# Patient Record
Sex: Male | Born: 1980 | Race: Asian | Hispanic: No | Marital: Married
Health system: Southern US, Community
[De-identification: ages and names within clinical notes are randomized; demographics above are authoritative.]

## PROBLEM LIST (undated history)

## (undated) DIAGNOSIS — N200 Calculus of kidney: Secondary | ICD-10-CM

## (undated) DIAGNOSIS — Z87442 Personal history of urinary calculi: Secondary | ICD-10-CM

## (undated) DIAGNOSIS — R413 Other amnesia: Secondary | ICD-10-CM

## (undated) DIAGNOSIS — N201 Calculus of ureter: Secondary | ICD-10-CM

## (undated) DIAGNOSIS — Z9889 Other specified postprocedural states: Secondary | ICD-10-CM

## (undated) DIAGNOSIS — R112 Nausea with vomiting, unspecified: Secondary | ICD-10-CM

## (undated) HISTORY — PX: PERCUTANEOUS NEPHROSTOLITHOTOMY: SHX2207

## (undated) HISTORY — PX: OTHER SURGICAL HISTORY: SHX169

---
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
## 2016-01-03 DIAGNOSIS — Z87828 Personal history of other (healed) physical injury and trauma: Secondary | ICD-10-CM

## 2016-01-03 HISTORY — DX: Personal history of other (healed) physical injury and trauma: Z87.828

## 2017-12-06 ENCOUNTER — Emergency Department (HOSPITAL_COMMUNITY)
Admission: EM | Admit: 2017-12-06 | Discharge: 2017-12-06 | Disposition: A | Discharge status: Home / Self Care | Payer: No Typology Code available for payment source | Attending: Emergency Medicine | Admitting: Emergency Medicine

## 2017-12-06 ENCOUNTER — Encounter (HOSPITAL_COMMUNITY): Payer: Self-pay

## 2017-12-06 DIAGNOSIS — Y990 Civilian activity done for income or pay: Secondary | ICD-10-CM | POA: Diagnosis not present

## 2017-12-06 DIAGNOSIS — W228XXA Striking against or struck by other objects, initial encounter: Secondary | ICD-10-CM | POA: Diagnosis not present

## 2017-12-06 DIAGNOSIS — Z23 Encounter for immunization: Secondary | ICD-10-CM | POA: Insufficient documentation

## 2017-12-06 DIAGNOSIS — Y939 Activity, unspecified: Secondary | ICD-10-CM | POA: Insufficient documentation

## 2017-12-06 DIAGNOSIS — S01111A Laceration without foreign body of right eyelid and periocular area, initial encounter: Secondary | ICD-10-CM | POA: Diagnosis not present

## 2017-12-06 DIAGNOSIS — S0181XA Laceration without foreign body of other part of head, initial encounter: Secondary | ICD-10-CM

## 2017-12-06 DIAGNOSIS — Y929 Unspecified place or not applicable: Secondary | ICD-10-CM | POA: Diagnosis not present

## 2017-12-06 HISTORY — DX: Calculus of kidney: N20.0

## 2017-12-06 MED ORDER — TETANUS-DIPHTH-ACELL PERTUSSIS 5-2.5-18.5 LF-MCG/0.5 IM SUSP
0.5000 mL | Freq: Once | INTRAMUSCULAR | Status: AC
  Administered 2017-12-06: 0.5 mL via INTRAMUSCULAR
  Filled 2017-12-06: qty 0.5

## 2017-12-06 NOTE — ED Provider Notes (Signed)
[facility] EMERGENCY DEPARTMENT Provider Note   CSN: [id number] Arrival date & time: 12/06/17  0030     History   Chief Complaint Chief Complaint  Patient presents with  . Laceration    HPI John Salas is a 37 y.o. male with a past medical history of kidney stones who presents today for evaluation of a facial laceration.  He was at work when he pulled a box of a shelf and it struck him on the right side of his face.  The box itself was not heavy.  He denies any visual changes.  He is unsure when his last tetanus shot was.  HPI  Past Medical History:  Diagnosis Date  . Kidney stones     There are no active problems to display for this patient.   Past Surgical History:  Procedure Laterality Date  . kidney stones          Home Medications    Prior to Admission medications   Not on File    Family History No family history on file.  Social History Social History   Tobacco Use  . Smoking status: Not on file  Substance Use Topics  . Alcohol use: Not on file  . Drug use: Not on file     Allergies   Patient has no known allergies.   Review of Systems Review of Systems  Constitutional: Negative for chills and fever.  Eyes: Negative for pain, redness and visual disturbance.  Gastrointestinal: Negative for nausea and vomiting.  Skin: Positive for wound.  Neurological: Negative for dizziness, light-headedness and headaches.  All other systems reviewed and are negative.    Physical Exam Updated Vital Signs BP 126/90 (BP Location: Right Arm)   Pulse 64   Temp 98.1 F (36.7 C) (Oral)   Resp 14   SpO2 100%   Physical Exam  Constitutional: He is oriented to person, place, and time. He appears well-developed. No distress.  HENT:  Head: Normocephalic.  Mouth/Throat: Oropharynx is clear and moist.  1 cm superficial laceration on the lateral part of the right eyebrow.  No obvious bleeding.  No surrounding erythema, edema.  No  crepitus or palpable deformity.  No raccoon's eyes or battle signs bilaterally.  Eyes: Pupils are equal, round, and reactive to light. EOM are normal.  Full pain-free EOM  Neurological: He is alert and oriented to person, place, and time.  Conversant, moves all 4 extremities.  Speech is not slurred.  Skin: Skin is warm and dry. He is not diaphoretic.  1 cm facial laceration, through the dermis partially  Nursing note and vitals reviewed.    ED Treatments / Results  Labs (all labs ordered are listed, but only abnormal results are displayed) Labs Reviewed - No data to display  EKG None  Radiology No results found.  Procedures .Marland KitchenLaceration Repair Date/Time: 12/06/2017 3:06 AM Performed by: Cristina Gong, PA-C Authorized by: Cristina Gong, PA-C   Consent:    Consent obtained:  Verbal   Consent given by:  Patient   Risks discussed:  Infection, need for additional repair, poor cosmetic result, pain, retained foreign body, tendon damage, vascular damage, poor wound healing and nerve damage   Alternatives discussed:  No treatment and referral (Alternative wound closures) Anesthesia (see MAR for exact dosages):    Anesthesia method:  None Laceration details:    Location:  Face   Face location:  R eyebrow   Length (cm):  1 Repair type:  Repair type:  Simple Exploration:    Hemostasis achieved with:  Direct pressure   Wound exploration: wound explored through full range of motion and entire depth of wound probed and visualized     Wound extent: no foreign bodies/material noted   Treatment:    Area cleansed with:  Shur-Clens   Amount of cleaning:  Standard Skin repair:    Repair method:  Tissue adhesive Approximation:    Approximation:  Close Post-procedure details:    Patient tolerance of procedure:  Tolerated well, no immediate complications   (including critical care time)  Medications Ordered in ED Medications  Tdap (BOOSTRIX) injection 0.5 mL (has no  administration in time range)     Initial Impression / Assessment and Plan / ED Course  I have reviewed the triage vital signs and the nursing notes.  Pertinent labs & imaging results that were available during my care of the patient were reviewed by me and considered in my medical decision making (see chart for details).    Patient presents today for evaluation of a 1 cm laceration on his right elbow that occurred after a box he was moving shifted scraping the right side of his face.  He is unsure when his last tetanus shot was, therefore Tdap was ordered.  Bleeding controlled, there is no surrounding swelling, bruising, or crepitus.  There is no abnormal redness of the eye and he has full pain-free EOMs.  Grossly neurologically intact.  Discussed closure methods, and elected for Dermabond.  OTC pain medicine.  Antibiotics not indicated, given mechanism, very small superficial laceration without surrounding swelling crepitus or abnormalities radiology is not indicated at this time.    Return precautions were discussed with patient who states their understanding.  At the time of discharge patient denied any unaddressed complaints or concerns.  Patient is agreeable for discharge home.    Final Clinical Impressions(s) / ED Diagnoses   Final diagnoses:  Facial laceration, initial encounter    ED Discharge Orders    None       Norman ClayHammond, Kayelee Herbig W, PA-C 12/06/17 0309    Dione BoozeGlick, David, MD 12/06/17 [phone]

## 2017-12-06 NOTE — ED Notes (Signed)
Small lac noted to R eyebrow; band aid in place. Wound cleansed causing slight bleeding; bandage reapplied. Dermabond and steri strips at bedside

## 2017-12-06 NOTE — Discharge Instructions (Addendum)
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
## 2019-10-10 ENCOUNTER — Emergency Department (HOSPITAL_BASED_OUTPATIENT_CLINIC_OR_DEPARTMENT_OTHER)
Admission: EM | Admit: 2019-10-10 | Discharge: 2019-10-10 | Disposition: A | Discharge status: Home / Self Care | Payer: Self-pay | Attending: Emergency Medicine | Admitting: Emergency Medicine

## 2019-10-10 ENCOUNTER — Other Ambulatory Visit: Payer: Self-pay

## 2019-10-10 ENCOUNTER — Encounter (HOSPITAL_BASED_OUTPATIENT_CLINIC_OR_DEPARTMENT_OTHER): Payer: Self-pay | Admitting: *Deleted

## 2019-10-10 ENCOUNTER — Emergency Department (HOSPITAL_BASED_OUTPATIENT_CLINIC_OR_DEPARTMENT_OTHER): Payer: Self-pay

## 2019-10-10 DIAGNOSIS — N201 Calculus of ureter: Secondary | ICD-10-CM | POA: Insufficient documentation

## 2019-10-10 DIAGNOSIS — F17200 Nicotine dependence, unspecified, uncomplicated: Secondary | ICD-10-CM | POA: Insufficient documentation

## 2019-10-10 LAB — COMPREHENSIVE METABOLIC PANEL
ALT: 21 U/L (ref 0–44)
AST: 23 U/L (ref 15–41)
Albumin: 4 g/dL (ref 3.5–5.0)
Alkaline Phosphatase: 84 U/L (ref 38–126)
Anion gap: 7 (ref 5–15)
BUN: 17 mg/dL (ref 6–20)
CO2: 25 mmol/L (ref 22–32)
Calcium: 8.5 mg/dL — ABNORMAL LOW (ref 8.9–10.3)
Chloride: 100 mmol/L (ref 98–111)
Creatinine, Ser: 0.89 mg/dL (ref 0.61–1.24)
GFR, Estimated: >60 mL/min (ref >60)
Glucose, Bld: 97 mg/dL (ref 70–99)
Potassium: 3.8 mmol/L (ref 3.5–5.1)
Sodium: 132 mmol/L — ABNORMAL LOW (ref 135–145)
Total Bilirubin: 0.3 mg/dL (ref 0.3–1.2)
Total Protein: 7.3 g/dL (ref 6.5–8.1)

## 2019-10-10 LAB — URINALYSIS, ROUTINE W REFLEX MICROSCOPIC
Bilirubin Urine: NEGATIVE
Glucose, UA: NEGATIVE mg/dL
Ketones, ur: NEGATIVE mg/dL
Leukocytes,Ua: NEGATIVE
Nitrite: NEGATIVE
Protein, ur: NEGATIVE mg/dL
Specific Gravity, Urine: 1.015 (ref 1.005–1.030)
pH: 6 (ref 5.0–8.0)

## 2019-10-10 LAB — CBC WITH DIFFERENTIAL/PLATELET
Abs Immature Granulocytes: 0.03 10*3/uL (ref 0.00–0.07)
Basophils Absolute: 0.1 10*3/uL (ref 0.0–0.1)
Basophils Relative: 1 %
Eosinophils Absolute: 0.2 10*3/uL (ref 0.0–0.5)
Eosinophils Relative: 2 %
HCT: 41 % (ref 39.0–52.0)
Hemoglobin: 13.4 g/dL (ref 13.0–17.0)
Immature Granulocytes: 0 %
Lymphocytes Relative: 31 %
Lymphs Abs: 3.1 10*3/uL (ref 0.7–4.0)
MCH: 26.3 pg (ref 26.0–34.0)
MCHC: 32.7 g/dL (ref 30.0–36.0)
MCV: 80.6 fL (ref 80.0–100.0)
Monocytes Absolute: 0.8 10*3/uL (ref 0.1–1.0)
Monocytes Relative: 8 %
Neutro Abs: 5.9 10*3/uL (ref 1.7–7.7)
Neutrophils Relative %: 58 %
Platelets: 272 10*3/uL (ref 150–400)
RBC: 5.09 MIL/uL (ref 4.22–5.81)
RDW: 13.3 % (ref 11.5–15.5)
WBC: 10.1 10*3/uL (ref 4.0–10.5)
nRBC: 0 % (ref 0.0–0.2)

## 2019-10-10 LAB — URINALYSIS, MICROSCOPIC (REFLEX)

## 2019-10-10 MED ORDER — ONDANSETRON HCL 4 MG PO TABS: 4.0000 mg | ORAL_TABLET | Freq: Three times a day (TID) | ORAL | 0 refills | Status: AC | PRN

## 2019-10-10 MED ORDER — OXYCODONE-ACETAMINOPHEN 5-325 MG PO TABS: 1.0000 | ORAL_TABLET | ORAL | 0 refills | Status: DC | PRN

## 2019-10-10 MED ORDER — TAMSULOSIN HCL 0.4 MG PO CAPS: 0.4000 mg | ORAL_CAPSULE | Freq: Every day | ORAL | 0 refills | Status: AC

## 2019-10-10 MED ORDER — SODIUM CHLORIDE 0.9 % IV BOLUS
1000.0000 mL | Freq: Once | INTRAVENOUS | Status: AC
  Administered 2019-10-10: 1000 mL via INTRAVENOUS

## 2019-10-10 MED ORDER — ONDANSETRON HCL 4 MG/2ML IJ SOLN
4.0000 mg | Freq: Once | INTRAMUSCULAR | Status: AC
  Administered 2019-10-10: 4 mg via INTRAVENOUS
  Filled 2019-10-10: qty 2

## 2019-10-10 MED ORDER — KETOROLAC TROMETHAMINE 30 MG/ML IJ SOLN
15.0000 mg | Freq: Once | INTRAMUSCULAR | Status: AC
  Administered 2019-10-10: 15 mg via INTRAVENOUS
  Filled 2019-10-10: qty 1

## 2019-10-10 NOTE — Discharge Instructions (Addendum)
You have a 77mm stone in your right ureter.  Get rechecked immediately at the [facility] [place] Emergency Department if you develop fevers, uncontrolled pain or cannot urinate.

## 2019-10-10 NOTE — ED Triage Notes (Signed)
C/o sudden onset of right flank pain x 2 hrs , hx stones

## 2019-10-10 NOTE — ED Provider Notes (Signed)
[facility] EMERGENCY DEPARTMENT Provider Note   CSN: [id number] Arrival date & time: 10/10/19  1607     History Chief Complaint  Patient presents with  . Flank Pain    John Salas is a 39 y.o. male.  The history is provided by the patient and the spouse.  Flank Pain   John Salas is a 39 y.o. male who presents to the Emergency Department complaining of flank pain.  He presents to the ED accompanied by his wife for evaluation of right flank pain that started that started at 930 this morning.  Pain is pulsating, non-radiating, severe and constant in nature.  Has associated nausea.  Denies fevers, dysuria, hematuria.  Had dysuria a few days ago - now resolved.  Has a hx/o kidney stones that required lithotripsy 15 years ago in [place] for left sided kidney stones.      Past Medical History:  Diagnosis Date  . Kidney stone   . Kidney stones     There are no problems to display for this patient.   Past Surgical History:  Procedure Laterality Date  . kidney stones         No family history on file.  Social History   Tobacco Use  . Smoking status: Current Every Day Smoker    Packs/day: 0.50  . Smokeless tobacco: Never Used  Vaping Use  . Vaping Use: Every day  Substance Use Topics  . Alcohol use: Not Currently  . Drug use: Not Currently    Home Medications Prior to Admission medications   Medication Sig Start Date End Date Taking? Authorizing Provider  ondansetron (ZOFRAN) 4 MG tablet Take 1 tablet (4 mg total) by mouth every 8 (eight) hours as needed for nausea or vomiting. 10/10/19   Tilden Fossa, MD  oxyCODONE-acetaminophen (PERCOCET/ROXICET) 5-325 MG tablet Take 1 tablet by mouth every 4 (four) hours as needed for severe pain. 10/10/19   Tilden Fossa, MD  tamsulosin (FLOMAX) 0.4 MG CAPS capsule Take 1 capsule (0.4 mg total) by mouth daily. 10/10/19   Tilden Fossa, MD    Allergies    Patient has no known allergies.  Review of Systems     Review of Systems  Genitourinary: Positive for flank pain.  All other systems reviewed and are negative.   Physical Exam Updated Vital Signs BP 111/78 (BP Location: Right Arm)   Pulse (!) 45   Temp (!) 97.5 F (36.4 C) (Oral)   Resp 16   SpO2 100%   Physical Exam Vitals and nursing note reviewed.  Constitutional:      General: He is in acute distress.     Appearance: He is well-developed.     Comments: Uncomfortable appearing  HENT:     Head: Normocephalic and atraumatic.  Cardiovascular:     Rate and Rhythm: Normal rate and regular rhythm.  Pulmonary:     Effort: Pulmonary effort is normal. No respiratory distress.  Abdominal:     Palpations: Abdomen is soft.     Tenderness: There is no abdominal tenderness. There is no right CVA tenderness, left CVA tenderness, guarding or rebound.  Musculoskeletal:        General: No swelling or tenderness.  Skin:    General: Skin is warm and dry.  Neurological:     Mental Status: He is alert and oriented to person, place, and time.  Psychiatric:        Behavior: Behavior normal.     ED Results / Procedures / Treatments  Labs (all labs ordered are listed, but only abnormal results are displayed) Labs Reviewed  URINALYSIS, ROUTINE W REFLEX MICROSCOPIC - Abnormal; Notable for the following components:      Result Value   Color, Urine STRAW (*)    Hgb urine dipstick SMALL (*)    All other components within normal limits  URINALYSIS, MICROSCOPIC (REFLEX) - Abnormal; Notable for the following components:   Bacteria, UA FEW (*)    All other components within normal limits  COMPREHENSIVE METABOLIC PANEL - Abnormal; Notable for the following components:   Sodium 132 (*)    Calcium 8.5 (*)    All other components within normal limits  URINE CULTURE  CBC WITH DIFFERENTIAL/PLATELET    EKG None  Radiology CT Renal Stone Study  Result Date: 10/10/2019 CLINICAL DATA:  Acute onset of right flank pain for 6 hours. Nausea.  Nephrolithiasis. EXAM: CT ABDOMEN AND PELVIS WITHOUT CONTRAST TECHNIQUE: Multidetector CT imaging of the abdomen and pelvis was performed following the standard protocol without IV contrast. COMPARISON:  None. FINDINGS: Lower chest: No acute findings. Hepatobiliary: No mass visualized on this unenhanced exam. Gallbladder is unremarkable. No evidence of biliary ductal dilatation. Pancreas: No mass or inflammatory process visualized on this unenhanced exam. Spleen:  Within normal limits in size. Adrenals/Urinary tract: Moderate right hydroureteronephrosis is seen due to a 9 mm calculus in the distal right ureter near the UVJ. Mild right perinephric stranding is seen as well as mild right renal atrophy. No other calculi identified. Stomach/Bowel: No evidence of obstruction, inflammatory process, or abnormal fluid collections. Aortic atherosclerotic calcification noted. Vascular/Lymphatic: No pathologically enlarged lymph nodes identified. No evidence of abdominal aortic aneurysm. Reproductive:  No mass or other significant abnormality. Other:  None. Musculoskeletal:  No suspicious bone lesions identified. IMPRESSION: Moderate right hydroureteronephrosis due to 9 mm distal right ureteral calculus near the UVJ. Aortic Atherosclerosis (ICD10-I70.0). Electronically Signed   By: Danae Orleans M.D.   On: 10/10/2019 16:57    Procedures Procedures (including critical care time)  Medications Ordered in ED Medications  ketorolac (TORADOL) 30 MG/ML injection 15 mg (15 mg Intravenous Given 10/10/19 1721)  ondansetron (ZOFRAN) injection 4 mg (4 mg Intravenous Given 10/10/19 1720)  sodium chloride 0.9 % bolus 1,000 mL (0 mLs Intravenous Stopped 10/10/19 1829)    ED Course  I have reviewed the triage vital signs and the nursing notes.  Pertinent labs & imaging results that were available during my care of the patient were reviewed by me and considered in my medical decision making (see chart for details).    MDM  Rules/Calculators/A&P                         Patient with history of kidney stones here for evaluation of right flank pain. On initial presentation he was very uncomfortable appearing. After treatment with Toradol his pain is well-controlled and he appears significantly better. UA not consistent with UTI. BMP with normal renal function. CT stone study demonstrates a 9 mm distal right ureteral stone with associated hydronephrosis. Discussed the case with Dr. Cardell Peach with urology, given patient's pain is well-controlled will discharge home with outpatient neurology follow-up. Discussed with patient home care, return precautions.  Final Clinical Impression(s) / ED Diagnoses Final diagnoses:  Right ureteral stone    Rx / DC Orders ED Discharge Orders         Ordered    tamsulosin (FLOMAX) 0.4 MG CAPS capsule  Daily  10/10/19 1820    ondansetron (ZOFRAN) 4 MG tablet  Every 8 hours PRN        10/10/19 1820    oxyCODONE-acetaminophen (PERCOCET/ROXICET) 5-325 MG tablet  Every 4 hours PRN        10/10/19 1820           Tilden Fossa, MD 10/10/19 1842

## 2019-10-12 LAB — URINE CULTURE: Culture: NO GROWTH

## 2019-10-17 ENCOUNTER — Other Ambulatory Visit: Payer: Self-pay | Admitting: Urology

## 2019-10-21 ENCOUNTER — Encounter (HOSPITAL_BASED_OUTPATIENT_CLINIC_OR_DEPARTMENT_OTHER): Payer: Self-pay | Admitting: Urology

## 2019-10-22 ENCOUNTER — Other Ambulatory Visit: Payer: Self-pay

## 2019-10-22 ENCOUNTER — Encounter (HOSPITAL_BASED_OUTPATIENT_CLINIC_OR_DEPARTMENT_OTHER): Payer: Self-pay | Admitting: Urology

## 2019-10-22 NOTE — Progress Notes (Signed)
Spoke w/ via phone for pre-op interview--- Pt's wife Memoona, pt speaks Urdu languange Lab needs dos----  no             Lab results------no COVID test ------ 10-28-2019 @ 0805 Arrive at ------- 0530 NPO after MN Medications to take morning of surgery ----- may take oxycodone/ zofran if needed Diabetic medication ----- n/a Patient Special Instructions ----- n/a Pre-Op special Istructions ----- pt wife will be interpreter for pt dos Patient verbalized understanding of instructions that were given at this phone interview. Patient denies shortness of breath, chest pain, fever, cough at this phone interview.

## 2019-10-28 ENCOUNTER — Other Ambulatory Visit (HOSPITAL_COMMUNITY)
Admission: RE | Admit: 2019-10-28 | Discharge: 2019-10-28 | Disposition: A | Discharge status: Home / Self Care | Payer: HRSA Program | Source: Ambulatory Visit | Attending: Urology | Admitting: Urology

## 2019-10-28 DIAGNOSIS — Z01812 Encounter for preprocedural laboratory examination: Secondary | ICD-10-CM | POA: Diagnosis present

## 2019-10-28 DIAGNOSIS — Z20822 Contact with and (suspected) exposure to covid-19: Secondary | ICD-10-CM | POA: Diagnosis not present

## 2019-10-28 LAB — SARS CORONAVIRUS 2 (TAT 6-24 HRS): SARS Coronavirus 2: NEGATIVE
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
## 2019-10-31 ENCOUNTER — Ambulatory Visit (HOSPITAL_BASED_OUTPATIENT_CLINIC_OR_DEPARTMENT_OTHER)
Admission: RE | Admit: 2019-10-31 | Discharge: 2019-10-31 | Disposition: A | Discharge status: Home / Self Care | Payer: Self-pay | Attending: Urology | Admitting: Urology

## 2019-10-31 ENCOUNTER — Ambulatory Visit (HOSPITAL_BASED_OUTPATIENT_CLINIC_OR_DEPARTMENT_OTHER): Payer: Self-pay | Admitting: Anesthesiology

## 2019-10-31 ENCOUNTER — Encounter (HOSPITAL_BASED_OUTPATIENT_CLINIC_OR_DEPARTMENT_OTHER): Payer: Self-pay | Admitting: Urology

## 2019-10-31 ENCOUNTER — Encounter (HOSPITAL_BASED_OUTPATIENT_CLINIC_OR_DEPARTMENT_OTHER)
Admission: RE | Disposition: A | Discharge status: Home / Self Care | Payer: Self-pay | Source: Home / Self Care | Attending: Urology

## 2019-10-31 ENCOUNTER — Other Ambulatory Visit: Payer: Self-pay

## 2019-10-31 DIAGNOSIS — F17210 Nicotine dependence, cigarettes, uncomplicated: Secondary | ICD-10-CM | POA: Insufficient documentation

## 2019-10-31 DIAGNOSIS — Z87442 Personal history of urinary calculi: Secondary | ICD-10-CM | POA: Insufficient documentation

## 2019-10-31 DIAGNOSIS — N132 Hydronephrosis with renal and ureteral calculous obstruction: Secondary | ICD-10-CM | POA: Insufficient documentation

## 2019-10-31 HISTORY — DX: Other specified postprocedural states: Z98.890

## 2019-10-31 HISTORY — DX: Other amnesia: R41.3

## 2019-10-31 HISTORY — DX: Nausea with vomiting, unspecified: R11.2

## 2019-10-31 HISTORY — DX: Personal history of urinary calculi: Z87.442

## 2019-10-31 HISTORY — PX: HOLMIUM LASER APPLICATION: SHX5852

## 2019-10-31 HISTORY — PX: CYSTOSCOPY WITH RETROGRADE PYELOGRAM, URETEROSCOPY AND STENT PLACEMENT: SHX5789

## 2019-10-31 HISTORY — DX: Calculus of ureter: N20.1

## 2019-10-31 SURGERY — CYSTOURETEROSCOPY, WITH RETROGRADE PYELOGRAM AND STENT INSERTION
Anesthesia: General | Laterality: Right

## 2019-10-31 MED ORDER — ONDANSETRON HCL 4 MG/2ML IJ SOLN
INTRAMUSCULAR | Status: AC
  Filled 2019-10-31: qty 2

## 2019-10-31 MED ORDER — MIDAZOLAM HCL 5 MG/5ML IJ SOLN
INTRAMUSCULAR | Status: DC | PRN
  Administered 2019-10-31: 2 mg via INTRAVENOUS

## 2019-10-31 MED ORDER — MIDAZOLAM HCL 2 MG/2ML IJ SOLN
INTRAMUSCULAR | Status: AC
  Filled 2019-10-31: qty 2

## 2019-10-31 MED ORDER — LIDOCAINE 2% (20 MG/ML) 5 ML SYRINGE
INTRAMUSCULAR | Status: AC
  Filled 2019-10-31: qty 5

## 2019-10-31 MED ORDER — OXYCODONE-ACETAMINOPHEN 5-325 MG PO TABS
1.0000 | ORAL_TABLET | Freq: Three times a day (TID) | ORAL | 0 refills | Status: AC | PRN
Start: 2019-10-31 — End: ?

## 2019-10-31 MED ORDER — GENTAMICIN SULFATE 40 MG/ML IJ SOLN
330.0000 mg | INTRAVENOUS | Status: AC
  Administered 2019-10-31: 330 mg via INTRAVENOUS
  Filled 2019-10-31: qty 8.25

## 2019-10-31 MED ORDER — DEXAMETHASONE SODIUM PHOSPHATE 10 MG/ML IJ SOLN
INTRAMUSCULAR | Status: AC
  Filled 2019-10-31: qty 1

## 2019-10-31 MED ORDER — DEXAMETHASONE SODIUM PHOSPHATE 10 MG/ML IJ SOLN
INTRAMUSCULAR | Status: DC | PRN
  Administered 2019-10-31: 10 mg via INTRAVENOUS

## 2019-10-31 MED ORDER — KETOROLAC TROMETHAMINE 30 MG/ML IJ SOLN
INTRAMUSCULAR | Status: AC
  Filled 2019-10-31: qty 1

## 2019-10-31 MED ORDER — ONDANSETRON HCL 4 MG/2ML IJ SOLN: 4.0000 mg | Freq: Once | INTRAMUSCULAR | Status: DC | PRN

## 2019-10-31 MED ORDER — LACTATED RINGERS IV SOLN
INTRAVENOUS | Status: DC
  Administered 2019-10-31: 50 mL via INTRAVENOUS

## 2019-10-31 MED ORDER — LIDOCAINE 2% (20 MG/ML) 5 ML SYRINGE
INTRAMUSCULAR | Status: DC | PRN
  Administered 2019-10-31: 60 mg via INTRAVENOUS

## 2019-10-31 MED ORDER — AMISULPRIDE (ANTIEMETIC) 5 MG/2ML IV SOLN: 10.0000 mg | Freq: Once | INTRAVENOUS | Status: DC | PRN

## 2019-10-31 MED ORDER — KETOROLAC TROMETHAMINE 10 MG PO TABS: 10.0000 mg | ORAL_TABLET | Freq: Three times a day (TID) | ORAL | 0 refills | Status: AC | PRN

## 2019-10-31 MED ORDER — OXYCODONE HCL 5 MG/5ML PO SOLN: 5.0000 mg | Freq: Once | ORAL | Status: DC | PRN

## 2019-10-31 MED ORDER — IOHEXOL 300 MG/ML  SOLN
INTRAMUSCULAR | Status: DC | PRN
  Administered 2019-10-31: 17 mL via URETHRAL

## 2019-10-31 MED ORDER — PROPOFOL 10 MG/ML IV BOLUS
INTRAVENOUS | Status: DC | PRN
  Administered 2019-10-31: 200 mg via INTRAVENOUS

## 2019-10-31 MED ORDER — OXYCODONE HCL 5 MG PO TABS: 5.0000 mg | ORAL_TABLET | Freq: Once | ORAL | Status: DC | PRN

## 2019-10-31 MED ORDER — ONDANSETRON HCL 4 MG/2ML IJ SOLN
INTRAMUSCULAR | Status: DC | PRN
  Administered 2019-10-31: 4 mg via INTRAVENOUS

## 2019-10-31 MED ORDER — FENTANYL CITRATE (PF) 100 MCG/2ML IJ SOLN: 25.0000 ug | INTRAMUSCULAR | Status: DC | PRN

## 2019-10-31 MED ORDER — PROPOFOL 10 MG/ML IV BOLUS
INTRAVENOUS | Status: AC
  Filled 2019-10-31: qty 40

## 2019-10-31 MED ORDER — FENTANYL CITRATE (PF) 100 MCG/2ML IJ SOLN
INTRAMUSCULAR | Status: DC | PRN
  Administered 2019-10-31: 50 ug via INTRAVENOUS
  Administered 2019-10-31 (×2): 25 ug via INTRAVENOUS

## 2019-10-31 MED ORDER — KETOROLAC TROMETHAMINE 30 MG/ML IJ SOLN
INTRAMUSCULAR | Status: DC | PRN
  Administered 2019-10-31: 30 mg via INTRAVENOUS

## 2019-10-31 MED ORDER — CEPHALEXIN 500 MG PO CAPS: 500.0000 mg | ORAL_CAPSULE | Freq: Two times a day (BID) | ORAL | 0 refills | Status: AC

## 2019-10-31 MED ORDER — FENTANYL CITRATE (PF) 100 MCG/2ML IJ SOLN
INTRAMUSCULAR | Status: AC
  Filled 2019-10-31: qty 2

## 2019-10-31 SURGICAL SUPPLY — 23 items
BAG DRAIN URO-CYSTO SKYTR STRL (DRAIN) ×3 IMPLANT
BASKET LASER NITINOL 1.9FR (BASKET) ×3 IMPLANT
CATH INTERMIT  6FR 70CM (CATHETERS) ×3 IMPLANT
CLOTH BEACON ORANGE TIMEOUT ST (SAFETY) ×3 IMPLANT
FIBER LASER FLEXIVA 365 (UROLOGICAL SUPPLIES) IMPLANT
FIBER LASER TRAC TIP (UROLOGICAL SUPPLIES) ×3 IMPLANT
GLOVE BIO SURGEON STRL SZ7.5 (GLOVE) ×3 IMPLANT
GOWN STRL REUS W/TWL LRG LVL3 (GOWN DISPOSABLE) ×3 IMPLANT
GUIDEWIRE ANG ZIPWIRE 038X150 (WIRE) ×3 IMPLANT
GUIDEWIRE STR DUAL SENSOR (WIRE) ×3 IMPLANT
IV NS 1000ML (IV SOLUTION) ×2
IV NS 1000ML BAXH (IV SOLUTION) ×1 IMPLANT
IV NS IRRIG 3000ML ARTHROMATIC (IV SOLUTION) ×3 IMPLANT
KIT TURNOVER CYSTO (KITS) ×3 IMPLANT
MANIFOLD NEPTUNE II (INSTRUMENTS) ×3 IMPLANT
NS IRRIG 500ML POUR BTL (IV SOLUTION) ×3 IMPLANT
PACK CYSTO (CUSTOM PROCEDURE TRAY) ×3 IMPLANT
STENT POLARIS 5FRX26 (STENTS) ×3 IMPLANT
SYR 10ML LL (SYRINGE) ×3 IMPLANT
TUBE CONNECTING 12'X1/4 (SUCTIONS) ×1
TUBE CONNECTING 12X1/4 (SUCTIONS) ×2 IMPLANT
TUBE FEEDING 8FR 16IN STR KANG (MISCELLANEOUS) ×3 IMPLANT
TUBING UROLOGY SET (TUBING) ×3 IMPLANT

## 2019-10-31 NOTE — Brief Op Note (Signed)
10/31/2019  10:53 AM  PATIENT:  John Salas  39 y.o. male  PRE-OPERATIVE DIAGNOSIS:  RIGHT URETERAL STONE  POST-OPERATIVE DIAGNOSIS:  RIGHT URETERAL STONE  PROCEDURE:  Procedure(s) with comments: CYSTOSCOPY WITH RETROGRADE PYELOGRAM, URETEROSCOPY AND STENT PLACEMENT (Right) - 75 MINS HOLMIUM LASER APPLICATION (Right)  SURGEON:  Surgeon(s) and Role:    Sebastian Ache, MD - Primary  PHYSICIAN ASSISTANT:   ASSISTANTS: none   ANESTHESIA:   general  EBL:  5 mL   BLOOD ADMINISTERED:none  DRAINS: none   LOCAL MEDICATIONS USED:  NONE  SPECIMEN:  Source of Specimen:  Rt ureteral stone fragments  DISPOSITION OF SPECIMEN:  [facility] for compositional analysis  COUNTS:  YES  TOURNIQUET:  * No tourniquets in log *  DICTATION: .Other Dictation: Dictation Number [id number]  PLAN OF CARE: Discharge to home after PACU  PATIENT DISPOSITION:  PACU - hemodynamically stable.   Delay start of Pharmacological VTE agent (>24hrs) due to surgical blood loss or risk of bleeding: yes

## 2019-10-31 NOTE — Anesthesia Procedure Notes (Signed)
Procedure Name: LMA Insertion Date/Time: 10/31/2019 7:20 AM Performed by: Briant Sites, CRNA Pre-anesthesia Checklist: Patient identified, Emergency Drugs available, Suction available and Patient being monitored Patient Re-evaluated:Patient Re-evaluated prior to induction Oxygen Delivery Method: Circle system utilized Preoxygenation: Pre-oxygenation with 100% oxygen Induction Type: IV induction Ventilation: Mask ventilation without difficulty LMA: LMA inserted LMA Size: 4.0 Number of attempts: 1 Airway Equipment and Method: Bite block Placement Confirmation: positive ETCO2 Dental Injury: Teeth and Oropharynx as per pre-operative assessment

## 2019-10-31 NOTE — Transfer of Care (Signed)
Immediate Anesthesia Transfer of Care Note  Patient: John Salas  Procedure(s) Performed: CYSTOSCOPY WITH RETROGRADE PYELOGRAM, URETEROSCOPY AND STENT PLACEMENT (Right ) HOLMIUM LASER APPLICATION (Right )  Patient Location: PACU  Anesthesia Type:General  Level of Consciousness: drowsy  Airway & Oxygen Therapy: Patient Spontanous Breathing and Patient connected to nasal cannula oxygen  Post-op Assessment: Report given to RN  Post vital signs: Reviewed and stable  Last Vitals:  Vitals Value Taken Time  BP 119/86 10/31/19 0827  Temp    Pulse 59 10/31/19 0828  Resp 12 10/31/19 0828  SpO2 100 % 10/31/19 0828  Vitals shown include unvalidated device data.  Last Pain:  Vitals:   10/31/19 0639  TempSrc: Oral  PainSc: 0-No pain      Patients Stated Pain Goal: 5 (10/31/19 3568)  Complications: No complications documented.

## 2019-10-31 NOTE — Anesthesia Postprocedure Evaluation (Signed)
Anesthesia Post Note  Patient: John Salas  Procedure(s) Performed: CYSTOSCOPY WITH RETROGRADE PYELOGRAM, URETEROSCOPY AND STENT PLACEMENT (Right ) HOLMIUM LASER APPLICATION (Right )     Patient location during evaluation: PACU Anesthesia Type: General Level of consciousness: awake and alert Pain management: pain level controlled Vital Signs Assessment: post-procedure vital signs reviewed and stable Respiratory status: spontaneous breathing, nonlabored ventilation and respiratory function stable Cardiovascular status: blood pressure returned to baseline and stable Postop Assessment: no apparent nausea or vomiting Anesthetic complications: no   No complications documented.  Last Vitals:  Vitals:   10/31/19 0900 10/31/19 0950  BP: 111/76 105/70  Pulse: 64   Resp: 13 16  Temp:  36.6 C  SpO2: 99% 100%    Last Pain:  Vitals:   10/31/19 0950  TempSrc: Oral  PainSc: 0-No pain                 Lucretia Kern
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
## 2019-10-31 NOTE — Discharge Instructions (Signed)
1 - You may have urinary urgency (bladder spasms) and bloody urine on / off with stent in place. This is normal.  2 - Call MD or go to ER for fever >102, severe pain / nausea / vomiting not relieved by medications, or acute change in medical status  May return to work on Tuesday or Wednesday if feeling okay. Do not need to take flomax     Post Anesthesia Home Care Instructions  Activity: Get plenty of rest for the remainder of the day. A responsible individual must stay with you for 24 hours following the procedure.  For the next 24 hours, DO NOT: -Drive a car -Advertising copywriter -Drink alcoholic beverages -Take any medication unless instructed by your physician -Make any legal decisions or sign important papers.  Meals: Start with liquid foods such as gelatin or soup. Progress to regular foods as tolerated. Avoid greasy, spicy, heavy foods. If nausea and/or vomiting occur, drink only clear liquids until the nausea and/or vomiting subsides. Call your physician if vomiting continues.  Special Instructions/Symptoms: Your throat may feel dry or sore from the anesthesia or the breathing tube placed in your throat during surgery. If this causes discomfort, gargle with warm salt water. The discomfort should disappear within 24 hours.

## 2019-11-03 ENCOUNTER — Encounter (HOSPITAL_BASED_OUTPATIENT_CLINIC_OR_DEPARTMENT_OTHER): Payer: Self-pay | Admitting: Urology

## 2019-11-03 NOTE — Op Note (Signed)
John Salas, John Salas MEDICAL RECORD [id number] ACCOUNT [id number] DATE OF BIRTH:07-09-1980 FACILITY: [facility] LOCATION: [facility] PHYSICIAN:Jasara Corrigan, MD  OPERATIVE REPORT  DATE OF PROCEDURE:  10/31/2019  PREOPERATIVE DIAGNOSIS:  Large right ureteral stone with hydronephrosis.  PROCEDURES: 1.  Cystoscopy, right retrograde pyelogram interpretation. 2.  Right ureteroscopy with laser lithotripsy. 3.  Insertion of right ureteral stent 5 x 26 Polaris, no tether.  ESTIMATED BLOOD LOSS:  Nil.  COMPLICATIONS:  None.  SPECIMEN:  Right ureteral stone fragments for analysis.  FINDINGS: 1.  Large right distal ureteral stone as expected. 2.  Complete resolution of all accessible stone fragments larger than 1 mm in the right ureter following laser lithotripsy and basket extraction. 3.  Successful placement of right ureteral stent proximal end in renal pelvis, distal end in urinary bladder.  INDICATIONS:  The patient is a very pleasant 39 year old [place] man with history of recurrent urolithiasis.  He was found on workup of colicky flank pain to have a very large right distal ureteral stone over a centimeter.  He has some ipsilateral  hydronephrosis, but no major cortical thinning, also discussed management including recommended path of ureteroscopy with goal of stone free.  He wished to proceed.  Informed consent was obtained and placed in medical record.  DESCRIPTION OF PROCEDURE:  The patient being himself was verified, the procedure being right ureteroscopic stimulation was confirmed.  Procedure timeout was performed.  Intravenous antibiotics were administered.  General anesthesia induced.  The patient  was placed into a low lithotomy position, sterile field was created prepped and draped base of the penis, perineum and proximal thighs using iodine.  Cystourethroscopy was performed using 21-French rigid cystoscope with offset lens.  Inspection of  anterior and posterior urethra were  unremarkable.  Inspection of urinary bladder revealed no diverticula, calcifications or papillary lesions.  Ureteral orifices were singleton.  The right ureteral orifice was cannulated with a 6-French renal catheter  and right retrograde pyelogram was obtained.  Right retrograde pyelogram demonstrated a single right ureter with single system right kidney.  There was a large filling defect in distal ureter consistent with known stone.  There was mild to moderate hydronephrosis above this.  There was minimal  tortuosity.  A 0.038 ZIPwire was advanced to lower pole and set aside as a safety wire.  An 8-French feeding tube was placed in the urinary bladder for pressure release, and semirigid ureteroscopy performed the distal ureter on the right side along a  separate sensor working wire.  As expected, there was a very large stone in the distal ureter.  This was much too large for simple basketing.  Holmium laser energy then applied to stone using settings of 0.2 joules and 20 Hz escalating to 0.2 joules and  50 Hz and using dusting technique, approximately 90% of the stone was ablated.  Remaining 10% fragmented with the stones being sequentially grasped with the long axis with an escape basket and displaced to the level of the urinary bladder.  Following  this, complete resolution of all accessible stone fragments larger than one-third mm within the right ureter.  Given the large size of stone and some expected mucosal edema at the site of stone it was felt that interval stenting with a nontethered stent  would be warranted.  As such, a new 5 x 26 Polaris-type stent was placed over remaining safety wire using fluoroscopic guidance.  Good proximal and distal planes were noted.  Stone fragments were irrigated from the bladder using the cystoscope sheath.  Procedure was terminated.  The patient tolerated the procedure well.  No immediate complications.  The patient was taken to postanesthesia care in stable  condition.  Plan for discharge home.  HN/NUANCE  D:10/31/2019 T:10/31/2019 JOB:[id number]

## 2021-10-21 IMAGING — CT CT RENAL STONE PROTOCOL
2 of 4 series · 16 of 46 positions shown, 18 images · non-contrast
Comparison: None.

CLINICAL DATA: Acute onset of right flank pain for 6 hours. Nausea.
Nephrolithiasis.

EXAM:
CT ABDOMEN AND PELVIS WITHOUT CONTRAST
TECHNIQUE: Multidetector CT imaging of the abdomen and pelvis was performed
following the standard protocol without IV contrast.

[Series 2: axial st · axial · 0.76mm/px · z∈[-487,-47]mm · 13 of 96 slices shown, 15 images]
[im 4/96  soft-tissue]
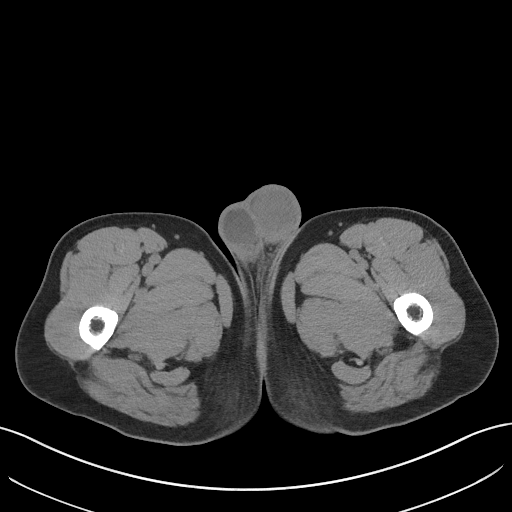
[im 4/96  bone]
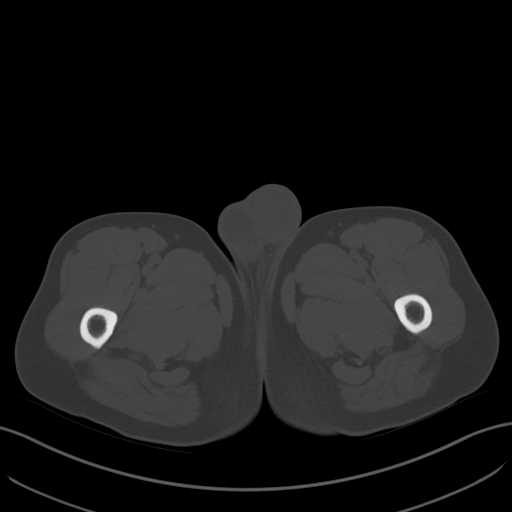
[im 11/96  soft-tissue]
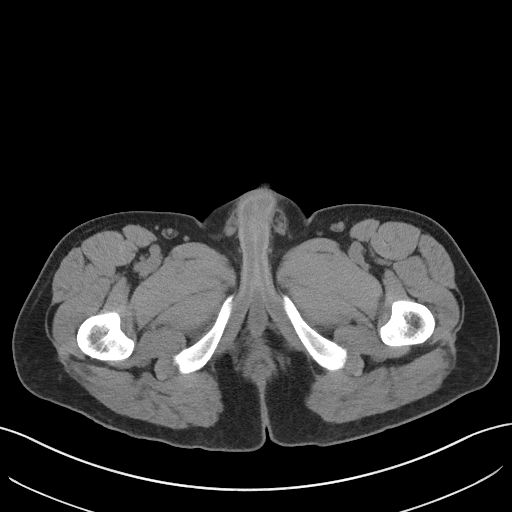
[im 19/96  soft-tissue]
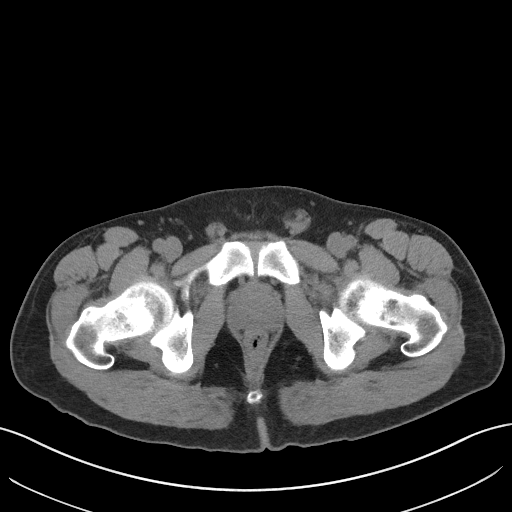
[im 26/96  soft-tissue]
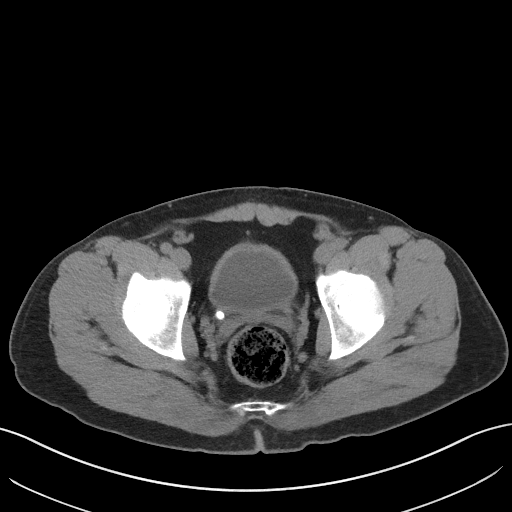
[im 33/96  soft-tissue]
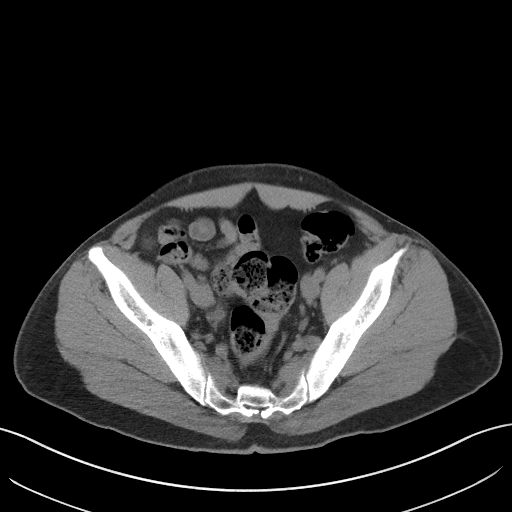
[im 41/96  soft-tissue]
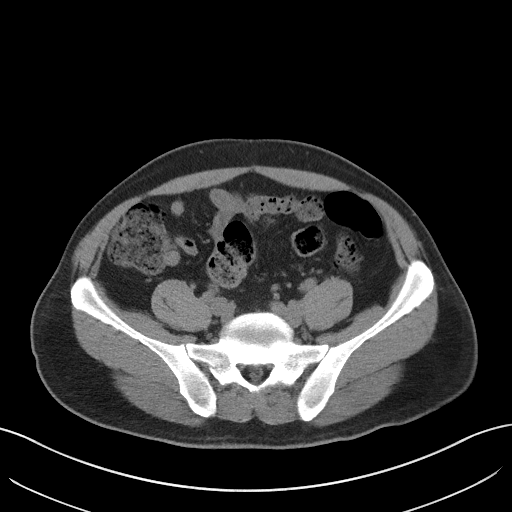
[im 48/96  soft-tissue]
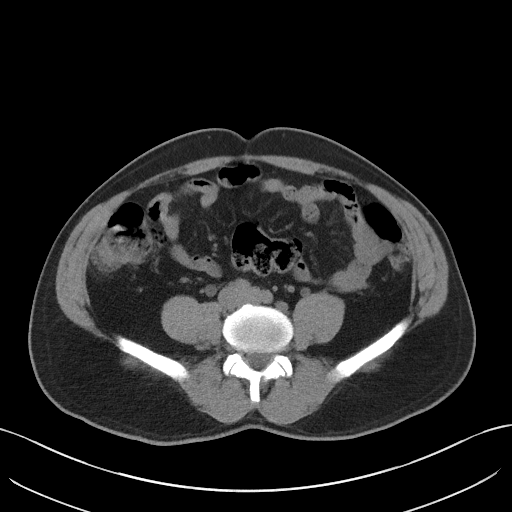
[im 55/96  soft-tissue]
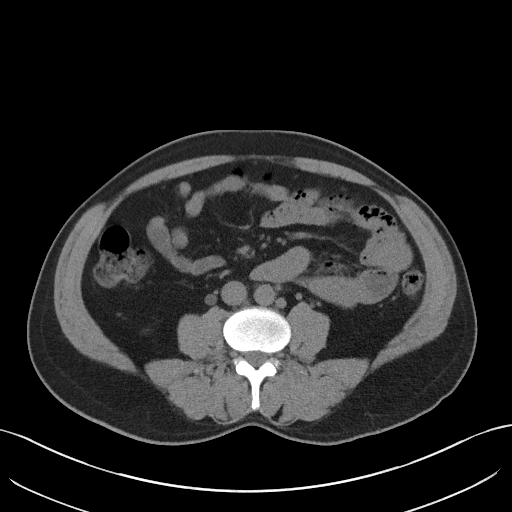
[im 63/96  soft-tissue]
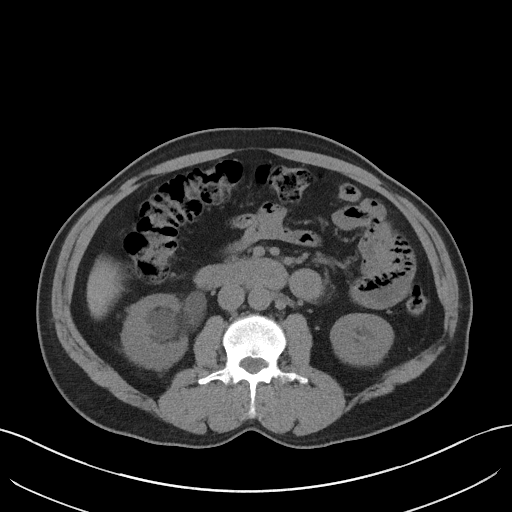
[im 63/96  bone]
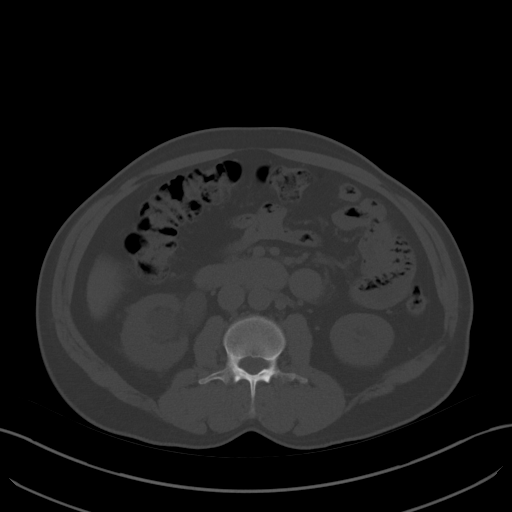
[im 70/96  soft-tissue]
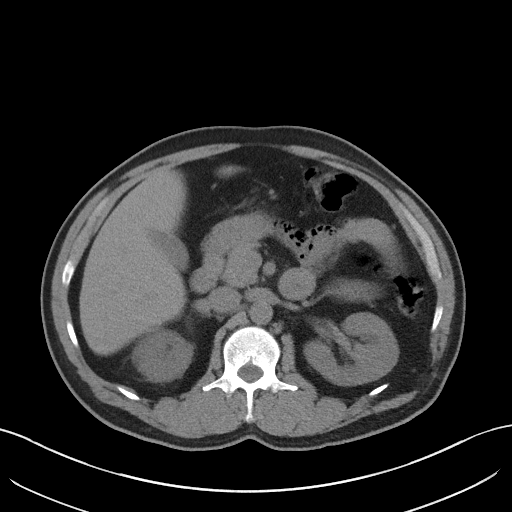
[im 77/96  soft-tissue]
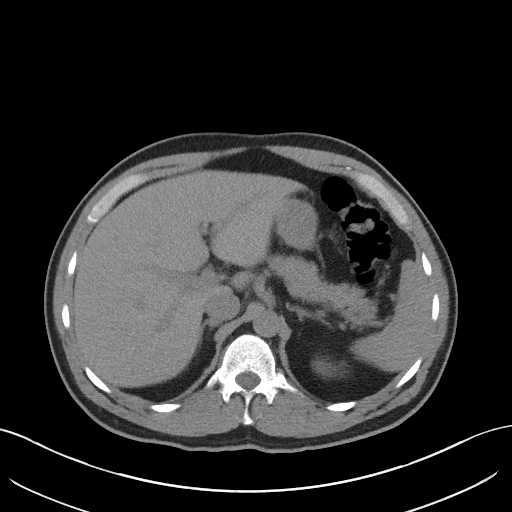
[im 85/96  soft-tissue]
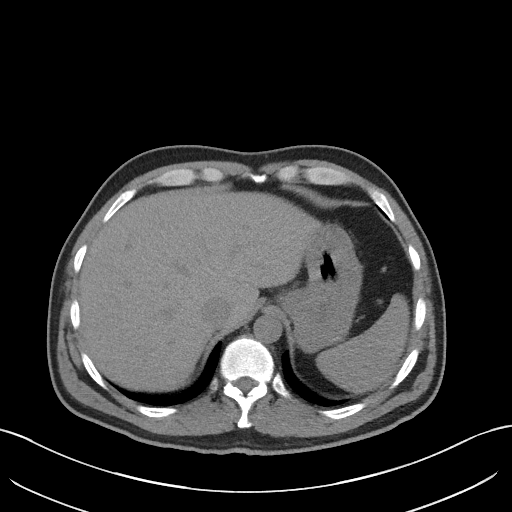
[im 92/96  soft-tissue]
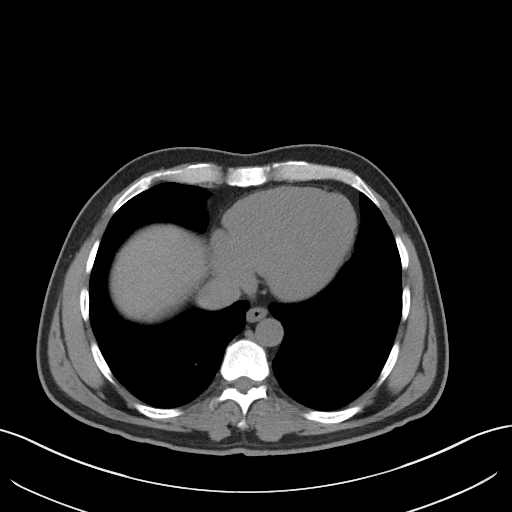

[Series 5: coronal st · coronal · 0.74mm/px · 3 of 87 slices shown]
[im 29/87  soft-tissue]
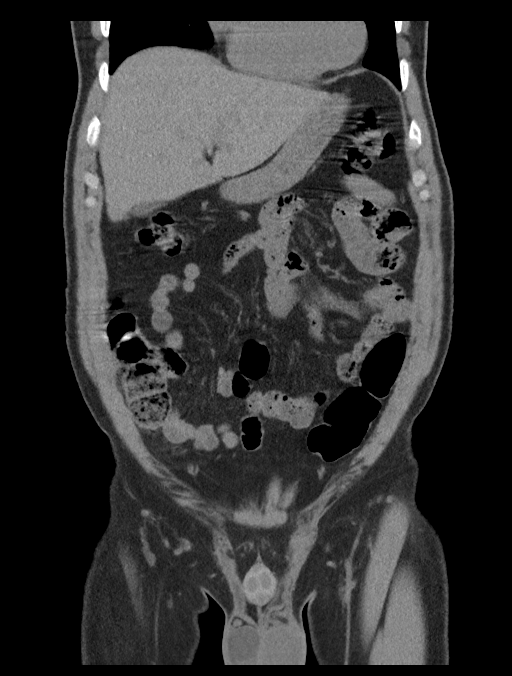
[im 39/87  soft-tissue]
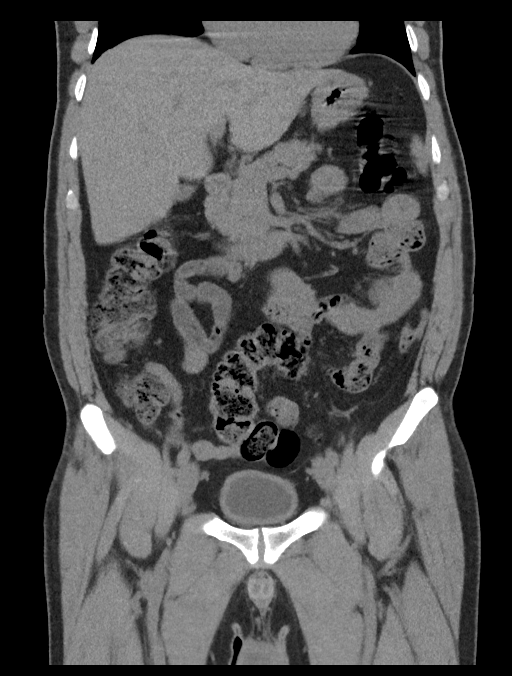
[im 48/87  soft-tissue]
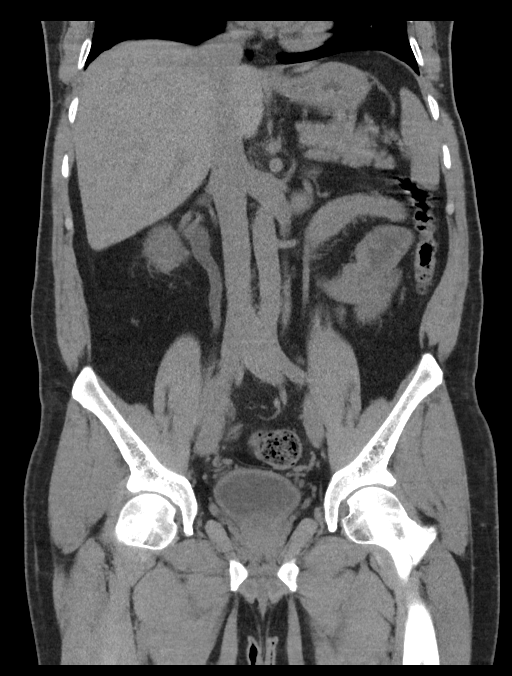

[16 of 46 positions shown; findings below may reference images not displayed]

FINDINGS: Lower chest: No acute findings.

Hepatobiliary: No mass visualized on this unenhanced exam.
Gallbladder is unremarkable. No evidence of biliary ductal
dilatation.

Pancreas: No mass or inflammatory process visualized on this
unenhanced exam.

Spleen:  Within normal limits in size.

Adrenals/Urinary tract: Moderate right hydroureteronephrosis is seen
due to a 9 mm calculus in the distal right ureter near the UVJ. Mild
right perinephric stranding is seen as well as mild right renal
atrophy. No other calculi identified.

Stomach/Bowel: No evidence of obstruction, inflammatory process, or
abnormal fluid collections. Aortic atherosclerotic calcification
noted.

Vascular/Lymphatic: No pathologically enlarged lymph nodes
identified. No evidence of abdominal aortic aneurysm.

Reproductive:  No mass or other significant abnormality.

Other:  None.

Musculoskeletal:  No suspicious bone lesions identified.
IMPRESSION: Moderate right hydroureteronephrosis due to 9 mm distal right
ureteral calculus near the UVJ.

Aortic Atherosclerosis (S72G1-[id number].W).

## 2022-11-10 ENCOUNTER — Encounter (HOSPITAL_BASED_OUTPATIENT_CLINIC_OR_DEPARTMENT_OTHER): Payer: Self-pay

## 2022-11-10 ENCOUNTER — Other Ambulatory Visit: Payer: Self-pay

## 2022-11-10 ENCOUNTER — Emergency Department (HOSPITAL_BASED_OUTPATIENT_CLINIC_OR_DEPARTMENT_OTHER)
Admission: EM | Admit: 2022-11-10 | Discharge: 2022-11-10 | Disposition: A | Discharge status: Home / Self Care | Payer: Medicaid Other | Attending: Emergency Medicine | Admitting: Emergency Medicine

## 2022-11-10 DIAGNOSIS — T65891A Toxic effect of other specified substances, accidental (unintentional), initial encounter: Secondary | ICD-10-CM | POA: Diagnosis present

## 2022-11-10 DIAGNOSIS — H5711 Ocular pain, right eye: Secondary | ICD-10-CM | POA: Insufficient documentation

## 2022-11-10 DIAGNOSIS — Z77098 Contact with and (suspected) exposure to other hazardous, chiefly nonmedicinal, chemicals: Secondary | ICD-10-CM

## 2022-11-10 MED ORDER — TOBRAMYCIN 0.3 % OP SOLN: 1.0000 [drp] | OPHTHALMIC | 0 refills | Status: AC

## 2022-11-10 MED ORDER — TETRACAINE HCL 0.5 % OP SOLN
2.0000 [drp] | Freq: Once | OPHTHALMIC | Status: AC
  Administered 2022-11-10: 2 [drp] via OPHTHALMIC
  Filled 2022-11-10: qty 4

## 2022-11-10 MED ORDER — FLUORESCEIN SODIUM 1 MG OP STRP
1.0000 | ORAL_STRIP | Freq: Once | OPHTHALMIC | Status: AC
  Administered 2022-11-10: 1 via OPHTHALMIC
  Filled 2022-11-10: qty 1

## 2022-11-10 NOTE — Discharge Instructions (Signed)
????? ??????? ?? ???? ????? ??? ?? ???? ???? ?? ???? ??? ??? ?? ?? ?? ?? ??? ????? ??? ???  ?? ?? ?? ?????? ??????? ??? ??? ???? ?????? ?? ??? ?? ?? ?? ?????? ??? ??? ???  ????? ??? ???? ?????? ?? ?????????? ??? ??? ????? ?? ???? ????? ??? ??? ?? ??? ?? ?? ??? ????? ??????? ??? ????? ??? ??? ??? ??? ???? ?? ?? ?? ???? ?? ??????? ?? ??? ??? ??? ?? ?? ?? ?? ?? ?????? ?? ????? ?? ?????? ?? ???? ?? ?? ??????  ?? ??? ?? ??? ??   6 ????? ??? ???????? ?? ???? ????  ??? ?????? ??? ????? ?? ???? ?? ????? ?? ???? ??? ER ?? ???? ??????  ?? ????? ???? ???????? ?? ???? ??? ??? ?? ??? ????? ?? ?????? ?? ??? ???? ?????? ?? ??? ????

## 2022-11-10 NOTE — ED Triage Notes (Signed)
The patient was washing a fence and splashed cleaner in his right eye. He is unable to tell me what type. He stated he flushed his eye prior to arrival. Right eye red.

## 2022-11-10 NOTE — ED Provider Notes (Signed)
[place] [facility] Provider Note   CSN: [id number] Arrival date & time: 11/10/22  1841     History  Chief Complaint  Patient presents with   Eye Problem    John Salas is a 42 y.o. male with no pertinent past medical history presented after he got deck cleaner in his right eye earlier this evening.  Patient was at his clients house when excellently got some of the deck cleaner in his eye.  Patient washed out his eye for 10  minutes and states that his eye is watery and so he cannot see out of his eye due to the water.  Patient denies any contact or lens use.  Patient that is initially red however improved after washing it out.  Patient states that his eye is irritated however does not hurt.  Patient does not see an ophthalmologist.  Translator was present: Patient's client  Interpretor was offered but patient refused  Home Medications Prior to Admission medications   Medication Sig Start Date End Date Taking? Authorizing Provider  tobramycin (TOBREX) 0.3 % ophthalmic solution Place 1 drop into the right eye every 4 (four) hours. 11/10/22  Yes Zykee Avakian, Beverly Gust, PA-C  cephALEXin (KEFLEX) 500 MG capsule Take 1 capsule (500 mg total) by mouth 2 (two) times daily. X 3 days. Begin day before next Urology appointment. 10/31/19   Loletta Parish., MD  ketorolac (TORADOL) 10 MG tablet Take 1 tablet (10 mg total) by mouth every 8 (eight) hours as needed for moderate pain. Or stent discomfort post-operatively 10/31/19   Loletta Parish., MD  ondansetron (ZOFRAN) 4 MG tablet Take 1 tablet (4 mg total) by mouth every 8 (eight) hours as needed for nausea or vomiting. 10/10/19   Tilden Fossa, MD  oxyCODONE-acetaminophen (PERCOCET/ROXICET) 5-325 MG tablet Take 1 tablet by mouth every 8 (eight) hours as needed for severe pain. Post-operatively 10/31/19   Loletta Parish., MD  tamsulosin (FLOMAX) 0.4 MG CAPS capsule Take 1 capsule (0.4 mg total) by  mouth daily. Patient taking differently: Take 0.4 mg by mouth every evening.  10/10/19   Tilden Fossa, MD      Allergies    Patient has no known allergies.    Review of Systems   Review of Systems  Physical Exam Updated Vital Signs BP (!) 127/91 (BP Location: Left Arm)   Pulse 81   Temp 98.6 F (37 C) (Oral)   Resp 18   Ht 5\' 6"  (1.676 m)   Wt 68 kg   SpO2 100%   BMI 24.20 kg/m  Physical Exam Constitutional:      General: He is not in acute distress. Eyes:     Extraocular Movements: Extraocular movements intact.     Conjunctiva/sclera: Conjunctivae normal.     Pupils: Pupils are equal, round, and reactive to light.     Comments: Right eye: Watery discharge when compared to left, no pupillary defects noted, no conjunctival changes, no fluorescein stain reuptake, pH 7, vision grossly intact  Neurological:     Mental Status: He is alert.     ED Results / Procedures / Treatments   Labs (all labs ordered are listed, but only abnormal results are displayed) Labs Reviewed - No data to display  EKG None  Radiology No results found.  Procedures Procedures    Medications Ordered in ED Medications  tetracaine (PONTOCAINE) 0.5 % ophthalmic solution 2 drop (2 drops Right Eye Given by Other 11/10/22  1901)  fluorescein ophthalmic strip 1 strip (1 strip Right Eye Given by Other 11/10/22 1901)    ED Course/ Medical Decision Making/ A&P                                 Medical Decision Making Risk Prescription drug management.   Rameir Ivanova 42 y.o. presented today for eye pain.  Working DDx that I considered at this time includes, but not limited to, chemical exposure, preorbital/orbital cellulitis, acute glaucoma, HSV infection, open globe, conjunctivitis, hordeolum/chalazion, FB, CRAO/CRVO.  R/o DDx: preorbital/orbital cellulitis, acute glaucoma, HSV infection, open globe, conjunctivitis, hordeolum/chalazion, FB, CRAO/CRVO: These are considered less likely due to  history of present illness, physical exam, lab/imaging findings.  Review of prior external notes: 02/28/2021 admission  Unique Tests and My Interpretation:  Visual Acuity: Bilateral 2025, could not assess right eye due to discharge pH: 7 Fluoroscein Stain: No fluorescein stain reuptake  Social Determinants of Health: none  Discussion with Independent Historian:  Client  Discussion of Management of Tests:  Sun, MD Ophthomologist  Risk: Medium: prescription drug management  Risk Stratification Score: None  Plan: On exam patient was in no acute distress with stable vitals.  On exam patient does have watery discharge on the right side however there were no concerning features associated with this.  Fluorescein stain was negative for any abrasions or Seidel sign.  pH was also normal.  Will rinse out patient's eyes however do suspect patient does have irritation from the deck cleaner with no concerning features at this time.  Patient states he can see out of his eye however given his erythema is a 5 seconds tends to irritate his eye which would be expected after having irritation to the eye from the chemicals.  Patient also having irritation after irrigation and so ophthalmology was consulted to see if they can help expedient the patient being seen in the outpatient setting.  Ophthalmology recommends tobramycin drops along with preservative-free eye drops. Patient is to call the ophthalmologist to get appointment for next week.  At time of discharge patient's vision is grossly intact and patient verbalized understanding acceptance of this plan.  Patient was given return precautions. Patient stable for discharge at this time.  Patient verbalized understanding of plan.  This chart was dictated using voice recognition software.  Despite best efforts to proofread,  errors can occur which can change the documentation meaning.         Final Clinical Impression(s) / ED Diagnoses Final  diagnoses:  Chemical exposure of eye    Rx / DC Orders ED Discharge Orders          Ordered    tobramycin (TOBREX) 0.3 % ophthalmic solution  Every 4 hours        11/10/22 2137              Remi Deter 11/10/22 2145    Jacalyn Lefevre, MD 11/10/22 2202

## 2022-11-10 NOTE — ED Notes (Signed)
Irrigation started w morgan lens per EDP order

## 2023-04-24 ENCOUNTER — Encounter (HOSPITAL_BASED_OUTPATIENT_CLINIC_OR_DEPARTMENT_OTHER): Payer: Self-pay | Admitting: Emergency Medicine

## 2023-04-24 ENCOUNTER — Other Ambulatory Visit: Payer: Self-pay

## 2023-04-24 DIAGNOSIS — R1111 Vomiting without nausea: Secondary | ICD-10-CM | POA: Insufficient documentation

## 2023-04-24 DIAGNOSIS — R109 Unspecified abdominal pain: Secondary | ICD-10-CM | POA: Insufficient documentation

## 2023-04-24 DIAGNOSIS — R1110 Vomiting, unspecified: Secondary | ICD-10-CM | POA: Diagnosis present

## 2023-04-24 LAB — CBC
HCT: 38.9 % — ABNORMAL LOW (ref 39.0–52.0)
Hemoglobin: 13.2 g/dL (ref 13.0–17.0)
MCH: 27 pg (ref 26.0–34.0)
MCHC: 33.9 g/dL (ref 30.0–36.0)
MCV: 79.6 fL — ABNORMAL LOW (ref 80.0–100.0)
Platelets: 301 10*3/uL (ref 150–400)
RBC: 4.89 MIL/uL (ref 4.22–5.81)
RDW: 13.8 % (ref 11.5–15.5)
WBC: 9 10*3/uL (ref 4.0–10.5)
nRBC: 0 % (ref 0.0–0.2)

## 2023-04-24 LAB — LIPASE, BLOOD: Lipase: 23 U/L (ref 11–51)

## 2023-04-24 LAB — COMPREHENSIVE METABOLIC PANEL WITH GFR
ALT: 17 U/L (ref 0–44)
AST: 21 U/L (ref 15–41)
Albumin: 4.3 g/dL (ref 3.5–5.0)
Alkaline Phosphatase: 108 U/L (ref 38–126)
Anion gap: 14 (ref 5–15)
BUN: 10 mg/dL (ref 6–20)
CO2: 22 mmol/L (ref 22–32)
Calcium: 9.3 mg/dL (ref 8.9–10.3)
Chloride: 104 mmol/L (ref 98–111)
Creatinine, Ser: 1.01 mg/dL (ref 0.61–1.24)
GFR, Estimated: >60 mL/min (ref >60)
Glucose, Bld: 77 mg/dL (ref 70–99)
Potassium: 4.1 mmol/L (ref 3.5–5.1)
Sodium: 140 mmol/L (ref 135–145)
Total Bilirubin: 0.2 mg/dL (ref 0.0–1.2)
Total Protein: 7.4 g/dL (ref 6.5–8.1)

## 2023-04-24 LAB — TROPONIN T, HIGH SENSITIVITY: Troponin T High Sensitivity: <15 ng/L (ref <19)

## 2023-04-24 NOTE — ED Triage Notes (Signed)
 Pt with vomiting today.  Reports one episode of vomiting food, the next one was water, then blood.  No abdominal pain.  States he has felt "down" the past couple of days.  Vomited x 3 today.  Not currently nauseated.  No chills or fever.

## 2023-04-25 ENCOUNTER — Emergency Department (HOSPITAL_BASED_OUTPATIENT_CLINIC_OR_DEPARTMENT_OTHER)

## 2023-04-25 ENCOUNTER — Emergency Department (HOSPITAL_BASED_OUTPATIENT_CLINIC_OR_DEPARTMENT_OTHER)
Admission: EM | Admit: 2023-04-25 | Discharge: 2023-04-25 | Disposition: A | Discharge status: Home / Self Care | Attending: Emergency Medicine | Admitting: Emergency Medicine

## 2023-04-25 DIAGNOSIS — R1111 Vomiting without nausea: Secondary | ICD-10-CM

## 2023-04-25 LAB — TROPONIN T, HIGH SENSITIVITY: Troponin T High Sensitivity: <15 ng/L (ref <19)

## 2023-04-25 MED ORDER — FAMOTIDINE 20 MG PO TABS: 20.0000 mg | ORAL_TABLET | Freq: Two times a day (BID) | ORAL | 0 refills | Status: AC

## 2023-04-25 MED ORDER — ALUM & MAG HYDROXIDE-SIMETH 200-200-20 MG/5ML PO SUSP
30.0000 mL | Freq: Once | ORAL | Status: AC
  Administered 2023-04-25: 30 mL via ORAL
  Filled 2023-04-25: qty 30

## 2023-04-25 MED ORDER — ONDANSETRON 8 MG PO TBDP: ORAL_TABLET | ORAL | 0 refills | Status: AC

## 2023-04-25 MED ORDER — ONDANSETRON HCL 4 MG/2ML IJ SOLN
4.0000 mg | Freq: Once | INTRAMUSCULAR | Status: AC
Start: 2023-04-25 — End: 2023-04-25
  Administered 2023-04-25: 4 mg via INTRAVENOUS
  Filled 2023-04-25: qty 2

## 2023-04-25 MED ORDER — SODIUM CHLORIDE 0.9 % IV BOLUS
500.0000 mL | Freq: Once | INTRAVENOUS | Status: AC
  Administered 2023-04-25: 500 mL via INTRAVENOUS

## 2023-04-25 NOTE — ED Notes (Signed)
 Pt has no complaints of abdominal pain only n/v Has not been able to keep anything down yesterday

## 2023-04-25 NOTE — ED Notes (Signed)
 Nausea has improved pt resting, wife at bedside

## 2023-04-25 NOTE — ED Notes (Signed)
 PO challenge given sprite

## 2023-04-25 NOTE — ED Provider Notes (Signed)
 [place] [facility] Provider Note   CSN: [id number] Arrival date & time: 04/24/23  1926     History  Chief Complaint  Patient presents with   Emesis    John Salas is a 43 y.o. male.  The history is provided by the patient.  Emesis Severity:  Mild Timing:  Sporadic Quality:  Stomach contents Progression:  Unchanged Chronicity:  New Recent urination:  Normal Context: not post-tussive   Relieved by:  Nothing Worsened by:  Nothing Ineffective treatments:  None tried Associated symptoms: no abdominal pain, no cough, no diarrhea, no fever and no sore throat   Risk factors: no sick contacts, no suspect food intake and no travel to endemic areas        Home Medications Prior to Admission medications   Medication Sig Start Date End Date Taking? Authorizing Provider  famotidine  (PEPCID ) 20 MG tablet Take 1 tablet (20 mg total) by mouth 2 (two) times daily. 04/25/23  Yes Carole Deere, MD  ondansetron  (ZOFRAN -ODT) 8 MG disintegrating tablet 8mg  ODT q8hours prn nausea 04/25/23  Yes Makeba Delcastillo, MD  cephALEXin  (KEFLEX ) 500 MG capsule Take 1 capsule (500 mg total) by mouth 2 (two) times daily. X 3 days. Begin day before next Urology appointment. 10/31/19   Melody Spurling., MD  ketorolac  (TORADOL ) 10 MG tablet Take 1 tablet (10 mg total) by mouth every 8 (eight) hours as needed for moderate pain. Or stent discomfort post-operatively 10/31/19   Melody Spurling., MD  ondansetron  (ZOFRAN ) 4 MG tablet Take 1 tablet (4 mg total) by mouth every 8 (eight) hours as needed for nausea or vomiting. 10/10/19   Kelsey Patricia, MD  oxyCODONE -acetaminophen  (PERCOCET/ROXICET) 5-325 MG tablet Take 1 tablet by mouth every 8 (eight) hours as needed for severe pain. Post-operatively 10/31/19   Melody Spurling., MD  tamsulosin  (FLOMAX ) 0.4 MG CAPS capsule Take 1 capsule (0.4 mg total) by mouth daily. Patient taking differently: Take 0.4 mg by mouth  every evening.  10/10/19   Kelsey Patricia, MD  tobramycin  (TOBREX ) 0.3 % ophthalmic solution Place 1 drop into the right eye every 4 (four) hours. 11/10/22   Denese Finn, PA-C      Allergies    Patient has no known allergies.    Review of Systems   Review of Systems  Constitutional:  Negative for fever.  HENT:  Negative for sore throat.   Respiratory:  Negative for cough.   Gastrointestinal:  Positive for vomiting. Negative for abdominal pain and diarrhea.  All other systems reviewed and are negative.   Physical Exam Updated Vital Signs BP 126/78   Pulse (!) 58   Temp 98.2 F (36.8 C)   Resp 18   SpO2 98%  Physical Exam Vitals and nursing note reviewed.  Constitutional:      General: He is not in acute distress.    Appearance: Normal appearance. He is well-developed. He is not diaphoretic.  HENT:     Head: Normocephalic and atraumatic.     Nose: Nose normal.  Eyes:     Conjunctiva/sclera: Conjunctivae normal.     Pupils: Pupils are equal, round, and reactive to light.  Cardiovascular:     Rate and Rhythm: Normal rate and regular rhythm.     Pulses: Normal pulses.     Heart sounds: Normal heart sounds.  Pulmonary:     Effort: Pulmonary effort is normal.     Breath sounds: Normal breath sounds. No  wheezing or rales.  Abdominal:     General: Bowel sounds are normal.     Palpations: Abdomen is soft.     Tenderness: There is no abdominal tenderness. There is no guarding or rebound.  Musculoskeletal:        General: Normal range of motion.     Cervical back: Normal range of motion and neck supple.  Skin:    General: Skin is warm and dry.     Capillary Refill: Capillary refill takes less than 2 seconds.  Neurological:     General: No focal deficit present.     Mental Status: He is alert and oriented to person, place, and time.     Deep Tendon Reflexes: Reflexes normal.  Psychiatric:        Mood and Affect: Mood normal.        Behavior: Behavior normal.      ED Results / Procedures / Treatments   Labs (all labs ordered are listed, but only abnormal results are displayed) Results for orders placed or performed during the hospital encounter of 04/25/23  Lipase, blood   Collection Time: 04/24/23  7:48 PM  Result Value Ref Range   Lipase 23 11 - 51 U/L  Comprehensive metabolic panel   Collection Time: 04/24/23  7:48 PM  Result Value Ref Range   Sodium 140 135 - 145 mmol/L   Potassium 4.1 3.5 - 5.1 mmol/L   Chloride 104 98 - 111 mmol/L   CO2 22 22 - 32 mmol/L   Glucose, Bld 77 70 - 99 mg/dL   BUN 10 6 - 20 mg/dL   Creatinine, Ser 1.91 0.61 - 1.24 mg/dL   Calcium 9.3 8.9 - 47.8 mg/dL   Total Protein 7.4 6.5 - 8.1 g/dL   Albumin 4.3 3.5 - 5.0 g/dL   AST 21 15 - 41 U/L   ALT 17 0 - 44 U/L   Alkaline Phosphatase 108 38 - 126 U/L   Total Bilirubin 0.2 0.0 - 1.2 mg/dL   GFR, Estimated >29 >56 mL/min   Anion gap 14 5 - 15  CBC   Collection Time: 04/24/23  7:48 PM  Result Value Ref Range   WBC 9.0 4.0 - 10.5 K/uL   RBC 4.89 4.22 - 5.81 MIL/uL   Hemoglobin 13.2 13.0 - 17.0 g/dL   HCT 21.3 (L) 08.6 - 57.8 %   MCV 79.6 (L) 80.0 - 100.0 fL   MCH 27.0 26.0 - 34.0 pg   MCHC 33.9 30.0 - 36.0 g/dL   RDW 46.9 62.9 - 52.8 %   Platelets 301 150 - 400 K/uL   nRBC 0.0 0.0 - 0.2 %  Troponin T, High Sensitivity   Collection Time: 04/24/23  7:48 PM  Result Value Ref Range   Troponin T High Sensitivity <15 <19 ng/L  Troponin T, High Sensitivity   Collection Time: 04/25/23  1:08 AM  Result Value Ref Range   Troponin T High Sensitivity <15 <19 ng/L   CT Renal Stone Study Result Date: 04/25/2023 CLINICAL DATA:  Abdominal/flank pain, stone suspected. History of kidney stones and right ureteral stone. EXAM: CT ABDOMEN AND PELVIS WITHOUT CONTRAST TECHNIQUE: Multidetector CT imaging of the abdomen and pelvis was performed following the standard protocol without IV contrast. RADIATION DOSE REDUCTION: This exam was performed according to the  departmental dose-optimization program which includes automated exposure control, adjustment of the mA and/or kV according to patient size and/or use of iterative reconstruction technique. COMPARISON:  10/10/2019 FINDINGS: Lower chest:  No acute abnormality. Hepatobiliary: Unremarkable liver. Normal gallbladder. No biliary dilation. Pancreas: Unremarkable. Spleen: Unremarkable. Adrenals/Urinary Tract: Normal adrenal glands. No urinary calculi or hydronephrosis. Bladder is unremarkable. Stomach/Bowel: Normal caliber large and small bowel. No bowel wall thickening. The appendix is normal.Stomach is within normal limits. Vascular/Lymphatic: No significant vascular findings are present. No enlarged abdominal or pelvic lymph nodes. Reproductive: Unremarkable. Other: No free intraperitoneal fluid or air. Musculoskeletal: No acute fracture. IMPRESSION: No acute abnormality in the abdomen or pelvis. No urinary calculi or hydronephrosis. Electronically Signed   By: Rozell Cornet M.D.   On: 04/25/2023 01:31    EKG EKG Interpretation Date/Time:  Wednesday Bach Rocchi 23 2025 00:13:11 EDT Ventricular Rate:  55 PR Interval:  164 QRS Duration:  83 QT Interval:  404 QTC Calculation: 387 R Axis:   69  Text Interpretation: Sinus rhythm Confirmed by Gayle Martinez ([id number]) on 04/25/2023 12:37:27 AM  Radiology CT Renal Stone Study Result Date: 04/25/2023 CLINICAL DATA:  Abdominal/flank pain, stone suspected. History of kidney stones and right ureteral stone. EXAM: CT ABDOMEN AND PELVIS WITHOUT CONTRAST TECHNIQUE: Multidetector CT imaging of the abdomen and pelvis was performed following the standard protocol without IV contrast. RADIATION DOSE REDUCTION: This exam was performed according to the departmental dose-optimization program which includes automated exposure control, adjustment of the mA and/or kV according to patient size and/or use of iterative reconstruction technique. COMPARISON:  10/10/2019 FINDINGS: Lower  chest: No acute abnormality. Hepatobiliary: Unremarkable liver. Normal gallbladder. No biliary dilation. Pancreas: Unremarkable. Spleen: Unremarkable. Adrenals/Urinary Tract: Normal adrenal glands. No urinary calculi or hydronephrosis. Bladder is unremarkable. Stomach/Bowel: Normal caliber large and small bowel. No bowel wall thickening. The appendix is normal.Stomach is within normal limits. Vascular/Lymphatic: No significant vascular findings are present. No enlarged abdominal or pelvic lymph nodes. Reproductive: Unremarkable. Other: No free intraperitoneal fluid or air. Musculoskeletal: No acute fracture. IMPRESSION: No acute abnormality in the abdomen or pelvis. No urinary calculi or hydronephrosis. Electronically Signed   By: Rozell Cornet M.D.   On: 04/25/2023 01:31    Procedures Procedures    Medications Ordered in ED Medications  ondansetron  (ZOFRAN ) injection 4 mg (4 mg Intravenous Given 04/25/23 0105)  sodium chloride  0.9 % bolus 500 mL (0 mLs Intravenous Stopped 04/25/23 0209)  alum & mag hydroxide-simeth (MAALOX/MYLANTA) 200-200-20 MG/5ML suspension 30 mL (30 mLs Oral Given 04/25/23 0107)    ED Course/ Medical Decision Making/ A&P                                 Medical Decision Making Vomiting x 3, has children but they are not sick   Amount and/or Complexity of Data Reviewed Independent Historian: spouse    Details: See above  External Data Reviewed: notes.    Details: Previous notes reviewed  Labs: ordered.    Details: Lipase is normal 23, 2 negative troponins < 15.  Normal white count 9, normal hemoglobin 13.2 normal platelets.  Normal sodium 140, normal potassium 4.1, normal creatinine, normal LFTS Radiology: ordered. ECG/medicine tests: ordered and independent interpretation performed.  Risk OTC drugs. Prescription drug management. Risk Details: Exam, vitals, labs and imaging are benign and reassuring.  Likely viral etiology.  Will treat symptomatically.  PO  challenged in the ED, stable for discharge with close follow up.  Work note provided.      Final Clinical Impression(s) / ED Diagnoses Final diagnoses:  Vomiting without nausea, unspecified vomiting type    No signs  of systemic illness or infection. The patient is nontoxic-appearing on exam and vital signs are within normal limits.  I have reviewed the triage vital signs and the nursing notes. Pertinent labs & imaging results that were available during my care of the patient were reviewed by me and considered in my medical decision making (see chart for details). After history, exam, and medical workup I feel the patient has been appropriately medically screened and is safe for discharge home. Pertinent diagnoses were discussed with the patient. Patient was given return precautions.    Rx / DC Orders ED Discharge Orders          Ordered    ondansetron  (ZOFRAN -ODT) 8 MG disintegrating tablet        04/25/23 0227    famotidine  (PEPCID ) 20 MG tablet  2 times daily        04/25/23 0228              Ireoluwa Gorsline, MD 04/25/23 1610

## 2023-04-25 NOTE — ED Notes (Signed)
 Pt tolerated PO challenge well.

## 2023-07-09 NOTE — Discharge Summary (Addendum)
 Attestation signed by [name], MD at 07/09/2023 12:31 PM   I saw and evaluated the patient.  He agrees to discontinue marijuana and states he will listen to family.  He denies SI/HI/AVH.  Does not appear manic on exam - calm and interruptable.  He reports adequate sleep and appetite and nursing reports he slept 8 hours.  I have reviewed the notes, assessments, and/or procedures performed by PA Milo, I concur with her/his documentation of John Salas.    Psychiatry Discharge Summary   Admit date: 07/02/2023 10:50 PM  Discharge date and time: 07/09/23  Discharge Physician: Johnston Durward Milo DEVONNA Berg  (Attending Physician)  Hospital LOS: 6 days  Hospital Course    Consulting Services: None  Indication for Admission: Bipolar disorder with mania  John Salas is a 43 y.o. male with a psychiatric history significant for [facility] use disorder and prior episode of psychosis in 2023, who presented to the ED on 07/03/2023 under petition for IVC by his wife for erratic and concerning behaviors.    On assessment today, Teryn is talkative but not pressured in speech, and his speech is coherent and linear in thought process, though there is a significant language barrier despite video interpreter. He has euthymic mood and affect, though is irritable at times discussing his family. He is perseverative on his family not listening to him and that he does not have any issues that warrant admission. Per IVC petition, patient has not slept in several days, has had incoherent speech and erratic behavior, and making statements that he was going to hurt himself and his wife. Overnight patient reportedly slept 6 hours, today his speech is coherent, and he has not displayed any erratic or bizarre behaviors on the unit. His UDS is positive for THC, and patient does admit to using THC recently. Of note, patient was admitted to this facility in 2023 with very similar presentation, at which time patient was  discharged with a diagnosis of psychosis thought to be substance induced via THC. Given patient's improvement today compared to presentation yesterday, leading differential at this time is substance induced mood disorder. Patient's reported symptoms of rapid, incoherent speech, lack of sleep for several days and bizarre behaviors could be attributed to hypomania characteristic of bipolar II diagnosis. At this time, diagnostic clarity is ongoing and a family meeting would be beneficial to gain further clarity and guide future management.   Prediabetes: HbA1c 5.9 on admission - Recommend PCP follow up at discharge, management with diet and lifestyle changes   Due to severity of symptoms and failure of outpatient management of patient's symptoms, the patient currently meets criteria for inpatient psychiatric care for medication management and acute crisis stabilization.   During the course of the hospitalization patient progressed well on oral Risperdal and tolerating without any adverse effects. Mood and affect improving. Remains somewhat bizarre and has minimal insight into current presentation. He has history of bipolar disorder with mania which is worsened in context of THC abuse and med non-compliance. Symptoms showed improvement with oral risperdal. Not interested in LAI at this time. Denies any SI, HI or AVH on multiple occasions through his stay. Sleep and appetite stable and slept 8hrs last night. Thought process is linear and logical and not noted RIS. Educated on need for sobriety from [facility] and he agrees. Wife has taken a restraining order against patient and she will be staying with her mother along with the children. Patient plans to go to his home and advised against  attempting to go to his mother in laws home. He understands and agrees. Given marked improvement since admission, plan is for discharge today. He will followupw with [facility] for OP services  The following medications were started/changed  during the hospitalization:  Risks, benefits and side effects of current medications reviewed.  - The following home medications were continued: None - The following home medications were not continued: None - The following new medications were initiated:  - 7/1: Started risperidone 1mg  BID (in ED) - 7/2 - 7/3: No changes             -  7/4 Continue Risperidone 1 mg QAM and 2 mgr at bedtime .  Behavior while hospitalized: cooperative and pleasant PRN medications required: none Medication compliance: Yes Group therapy attendance: Yes Medical issues addressed during this admission: none Admission labs revealed the following pertinent positives:   CBC: Hgb 13.8, hematocrit 39.6, MCV 77 CMP: AST 40 - otherwise unremarkable  Alcohol Biomarkers: MCV: 77/AST: 40/ALT: 28 UDS: + THC  Alcohol: <10 Imaging <redacted file path>: No results found. Hgb A1c: 5.9  Lipid Profile: T 137/Tg 77/HDL 41/LDL 80 TSH: 2.122 EKG Qtc:  UA: Unremarkable RPR: Non-reactive  At this time patient symptoms have been managed well with medications consistent with standard of care. Safety planning is coordinated with treatment team as well as the patient and family.  At time of discharge, patient is linear, direct, and organized; denies SI, HI, and AVH and is not currently significantly impaired, psychotic, or manic on exam. Detailed risk assessment is complete based on clinical exam and individual risk factors and acute suicide risk is low and acute violence risk is low. At this time, protective factors outweigh risk factors. Safety plan is created jointly which involves the patient following up with [facility] for med management and outpatient services. At this time, patient is educated and verbalized understanding of mental health resources and other crisis services in the community. They are instructed to call 911 and present to the nearest emergency room should they experience any SI/HI/AVH or detrimental worsening of  their mental health condition.  Regarding psychotropic medication treatment, the above psychotropic medications were prescribed and adjustment were made as described below (Medication changes during this hospitalization). Physician(s) and team provided education to the client on the risk and benefits of treatment options, instructions for follow-up, and importance of adherence to chosen treatment. Medication side effects, possible drug interactions between the prescription meds and their interaction with recreational substances was discussed with the patient. No intolerant adverse side effects, were reported or noted, on day of discharge.  On 07/09/23 , following sustained improvement in the affect of this patient, continued report of euthymic mood, repeated denial of suicidal, homicidal, and other violent ideation, adequate interaction with peers, active participation in groups while the unit, and denial of adverse reactions from medications, the treatment team decided Numair Solly was stable for discharge home with scheduled mental health treatment as noted below.  C-SSRS ([place] Suicide Severity Risk Scale and Protective Factors)   Since Last Asked Suicide Risk (Since Last Asked) Assessment - ongoing 1. Have you wished you were dead or wished you could go to sleep and not wake up? (Since Last Asked): No 2. Have you actually had any thoughts of killing yourself? (Since Last Asked): No 6. Have you done anything, started to do anything, or prepared to do anything to end your life? (Since Last Asked): No Calculated C-SSRS Risk Score (Since Last Asked): No Risk Indicated  Discharge Screening   Multiple Antipsychotics: none  FDA-approved cessation medication prescribed at discharge: Not applicable Note: if patient is heavy user, (average volume of five or more cigarettes per day and/or cigars daily and/or pipes daily during the past 30 days), the patient is required to have FDA-approved cessation  medication (nicotine patch/gum, bupropion, varenicline etc),  or a documented reason for not prescribing it.  Labs/Imaging During Admission   Results for orders placed or performed during the hospital encounter of 07/02/23  Drug of Abuse 7 Panel  Result Value Ref Range   Amphetamines Screen, Urine Negative Negative   Barbiturates Screen, Urine Negative Negative   Benzodiazepines Screen, Urine Negative Negative   Cocaine Screen, Urine Negative Negative   Opiates Screen, Urine Negative Negative   Fentanyl  Screen, Urine Negative Negative   Marijuana (THC) Screen, Urine Positive (A) Negative   Creatinine, Urine 22 >=20 mg/dL  Ethanol  Result Value Ref Range   Ethanol <10 <10 mg/dL  Urinalysis with Reflex to Microscopic  Result Value Ref Range   Color, Urine Colorless Yellow   Clarity, Urine Clear Clear   Specific Gravity, Urine 1.003 (L) 1.005 - 1.025   pH, Urine 6.0 5.0 - 8.0   Protein, Urine Negative Negative, 10 , 20  mg/dL   Glucose, Urine Negative Negative, 30 , 50  mg/dL   Ketones, Urine Negative Negative, Trace mg/dL   Bilirubin, Urine Negative Negative   Blood, Urine Negative Negative, Trace   Nitrite, Urine Negative Negative   Leukocyte Esterase, Urine Negative Negative, 25   Urobilinogen, Urine Normal <2.0 mg/dL  Comprehensive Metabolic Panel  Result Value Ref Range   Sodium 138 136 - 145 mmol/L   Potassium 4.1 3.4 - 4.5 mmol/L   Chloride 107 98 - 107 mmol/L   CO2 25 21 - 31 mmol/L   Anion Gap 6 6 - 14 mmol/L   Glucose, Random 88 70 - 99 mg/dL   Blood Urea Nitrogen (BUN) 16 7 - 25 mg/dL   Creatinine 8.93 9.29 - 1.30 mg/dL   eGFR 89 >40 fO/fpw/8.26f7   Albumin 4.3 3.5 - 5.7 g/dL   Total Protein 7.3 6.4 - 8.9 g/dL   Bilirubin, Total 0.6 0.3 - 1.0 mg/dL   Alkaline Phosphatase (ALP) 90 34 - 104 U/L   Aspartate Aminotransferase (AST) 40 (H) 13 - 39 U/L   Alanine Aminotransferase (ALT) 28 7 - 52 U/L   Calcium 8.6 8.6 - 10.3 mg/dL   BUN/Creatinine Ratio    CBC with  Differential  Result Value Ref Range   WBC 9.02 4.40 - 11.00 10*3/uL   RBC 5.14 4.50 - 5.90 10*6/uL   Hemoglobin 13.8 (L) 14.0 - 17.5 g/dL   Hematocrit 60.3 (L) 58.4 - 50.4 %   Mean Corpuscular Volume (MCV) 77.0 (L) 80.0 - 96.0 fL   Mean Corpuscular Hemoglobin (MCH) 26.9 (L) 27.5 - 33.2 pg   Mean Corpuscular Hemoglobin Conc (MCHC) 34.9 33.0 - 37.0 g/dL   Red Cell Distribution Width (RDW) 14.5 12.3 - 17.0 %   Platelet Count (PLT) 292 150 - 450 10*3/uL   Mean Platelet Volume (MPV) 7.0 6.8 - 10.2 fL   Neutrophils % 55 %   Lymphocytes % 35 %   Monocytes % 9 %   Eosinophils % 1 %   Basophils % 0 %   Neutrophils Absolute 4.90 1.80 - 7.80 10*3/uL   Lymphocytes # 3.20 1.00 - 4.80 10*3/uL   Monocytes # 0.80 0.00 - 0.80 10*3/uL   Eosinophils #  0.10 0.00 - 0.50 10*3/uL   Basophils # 0.00 0.00 - 0.20 10*3/uL  TSH With Reflex To Free T4  Result Value Ref Range   TSH 2.122 0.450 - 5.330 uIU/mL  Hemoglobin A1C With Estimated Average Glucose  Result Value Ref Range   Hemoglobin A1c 5.9 (H) <5.7 %   Estimated Average Glucose 123 70 - 154 mg/dL  Rapid Plasma Reagin (RPR), Qualitative Test with Reflex to Titer  Result Value Ref Range   RPR Non-Reactive Non-Reactive  Lipid Panel  Result Value Ref Range   Cholesterol, Total, Lipid Panel 137 <200 mg/dL   Triglycerides, Lipid Panel 77 <150 mg/dL   HDL Cholesterol - Lipid Panel 41 (L) >=60 mg/dL   LDL Cholesterol, Calculated 80 <100 mg/dL   Non-HDL Cholesterol 96 mg/dL    Discharge Mental Status Examination   Vitals:  Vitals: BP: 124/87, Temp: 97.8 F (36.6 C), Temp Source: Oral, Heart Rate: 68, Resp: 20, SpO2: 100 %;   Mental Status Examination: General Appearance normal body habitus, appears stated age, and hygiene appropriate  General Behavior cooperative and appropriate eye contact  Psychomotor Activity normoactive  Gait and Station no gait abnormalities  Speech             normal rate, fluent, normal volume, normal tone, normal  prosody, and normal amount  Mood             Better  Affect                         congruent  Thought Process linear/organized  Associations Intact  Thought Content/Perceptual Disturbances Denies SI or HI.  Denies AVH.  Denies paranoia.  Cognition/Sensorium             orientation grossly intact  Insight             Improving  Judgment Improving   Discharge Diagnosis   Principal Problem:   Bipolar disorder with severe mania    (CMD) Active Problems:   Cannabis use disorder, moderate, dependence    (CMD)   Discharge Medications:    Medication List     START taking these medications    * risperiDONE 2 mg Tab tablet Commonly known as: RisperDAL Take 1 tablet (2 mg total) by mouth at bedtime.   * risperiDONE 1 mg tablet Commonly known as: RisperDAL Take 1 tablet (1 mg total) by mouth every morning. Start taking on: July 10, 2023      * * This list has 2 medication(s) that are the same as other medications prescribed for you. Read the directions carefully, and ask your doctor or other care provider to review them with you.            Where to Get Your Medications     These medications were sent to [id number] - [place], [place] - [address] AT [facility] OF [address] [facility] [address] - PHONE: [phone] - FAX: [phone]  [address], [place]  [address]    Phone: [phone]  risperiDONE 1 mg tablet risperiDONE 2 mg Tab tablet     Risks, benefits, and side effects were discussed in detail prior to discharge.  Follow-up   Discharge Follow-Up Plan:  Clinical Follow-up (MH and/or SA Services):  Name of Provider Agency Referred: [facility] Date/Time of Appointment: Wednesday, 07/12/23 at 8:30am Phone Number: [phone] Address: [address] [place], [place] [address]  Reason: Mental Health Treatment  Transportation:  Family  Crisis Support Line: [phone]   Discharge Insturctions and Disposition   Discharge Instructions:  The  patient was referred to the providers listed above at the appointment time listed above for the treatment of behavioral health and substance use disorder.  Disposition: Home I have discussed the case with Dr. Vicenta, who has assisted in the formulation of the assessment and plan.  *Some images could not be shown.

## 2023-10-14 ENCOUNTER — Emergency Department (HOSPITAL_COMMUNITY): Payer: Self-pay

## 2023-10-14 ENCOUNTER — Emergency Department (HOSPITAL_COMMUNITY): Payer: MEDICAID

## 2023-10-14 ENCOUNTER — Inpatient Hospital Stay (HOSPITAL_COMMUNITY): Payer: MEDICAID

## 2023-10-14 ENCOUNTER — Inpatient Hospital Stay (HOSPITAL_COMMUNITY)
Admission: EM | Admit: 2023-10-14 | Discharge: 2023-10-16 | DRG: 489 | Disposition: A | Discharge status: Home / Self Care | Payer: MEDICAID | Attending: Surgery | Admitting: Surgery

## 2023-10-14 ENCOUNTER — Encounter (HOSPITAL_COMMUNITY): Admission: EM | Disposition: A | Discharge status: Home / Self Care | Payer: Self-pay | Source: Home / Self Care

## 2023-10-14 DIAGNOSIS — S72435A Nondisplaced fracture of medial condyle of left femur, initial encounter for closed fracture: Principal | ICD-10-CM

## 2023-10-14 DIAGNOSIS — S7292XB Unspecified fracture of left femur, initial encounter for open fracture type I or II: Secondary | ICD-10-CM | POA: Diagnosis present

## 2023-10-14 DIAGNOSIS — S51812A Laceration without foreign body of left forearm, initial encounter: Secondary | ICD-10-CM

## 2023-10-14 DIAGNOSIS — Z23 Encounter for immunization: Secondary | ICD-10-CM | POA: Diagnosis not present

## 2023-10-14 DIAGNOSIS — S80211A Abrasion, right knee, initial encounter: Secondary | ICD-10-CM | POA: Diagnosis present

## 2023-10-14 DIAGNOSIS — S81012A Laceration without foreign body, left knee, initial encounter: Secondary | ICD-10-CM | POA: Diagnosis present

## 2023-10-14 DIAGNOSIS — Y9355 Activity, bike riding: Secondary | ICD-10-CM

## 2023-10-14 DIAGNOSIS — F209 Schizophrenia, unspecified: Secondary | ICD-10-CM | POA: Diagnosis present

## 2023-10-14 DIAGNOSIS — S72402B Unspecified fracture of lower end of left femur, initial encounter for open fracture type I or II: Secondary | ICD-10-CM | POA: Diagnosis not present

## 2023-10-14 HISTORY — PX: INCISION AND DRAINAGE OF WOUND: SHX1803

## 2023-10-14 LAB — COMPREHENSIVE METABOLIC PANEL WITH GFR
ALT: 17 U/L (ref 0–44)
AST: 36 U/L (ref 15–41)
Albumin: 3.6 g/dL (ref 3.5–5.0)
Alkaline Phosphatase: 92 U/L (ref 38–126)
Anion gap: 19 — ABNORMAL HIGH (ref 5–15)
BUN: 22 mg/dL — ABNORMAL HIGH (ref 6–20)
CO2: 16 mmol/L — ABNORMAL LOW (ref 22–32)
Calcium: 8.6 mg/dL — ABNORMAL LOW (ref 8.9–10.3)
Chloride: 106 mmol/L (ref 98–111)
Creatinine, Ser: 1.54 mg/dL — ABNORMAL HIGH (ref 0.61–1.24)
GFR, Estimated: 57 mL/min — ABNORMAL LOW (ref >60)
Glucose, Bld: 160 mg/dL — ABNORMAL HIGH (ref 70–99)
Potassium: 3.4 mmol/L — ABNORMAL LOW (ref 3.5–5.1)
Sodium: 141 mmol/L (ref 135–145)
Total Bilirubin: 0.7 mg/dL (ref 0.0–1.2)
Total Protein: 6.5 g/dL (ref 6.5–8.1)

## 2023-10-14 LAB — I-STAT CHEM 8, ED
BUN: 24 mg/dL — ABNORMAL HIGH (ref 6–20)
Calcium, Ion: 1.06 mmol/L — ABNORMAL LOW (ref 1.15–1.40)
Chloride: 106 mmol/L (ref 98–111)
Creatinine, Ser: 1.5 mg/dL — ABNORMAL HIGH (ref 0.61–1.24)
Glucose, Bld: 151 mg/dL — ABNORMAL HIGH (ref 70–99)
HCT: 37 % — ABNORMAL LOW (ref 39.0–52.0)
Hemoglobin: 12.6 g/dL — ABNORMAL LOW (ref 13.0–17.0)
Potassium: 3.3 mmol/L — ABNORMAL LOW (ref 3.5–5.1)
Sodium: 144 mmol/L (ref 135–145)
TCO2: 19 mmol/L — ABNORMAL LOW (ref 22–32)

## 2023-10-14 LAB — CBC
HCT: 34.1 % — ABNORMAL LOW (ref 39.0–52.0)
HCT: 39.3 % (ref 39.0–52.0)
Hemoglobin: 11.3 g/dL — ABNORMAL LOW (ref 13.0–17.0)
Hemoglobin: 12.6 g/dL — ABNORMAL LOW (ref 13.0–17.0)
MCH: 26.7 pg (ref 26.0–34.0)
MCH: 26.8 pg (ref 26.0–34.0)
MCHC: 32.1 g/dL (ref 30.0–36.0)
MCHC: 33.1 g/dL (ref 30.0–36.0)
MCV: 80.6 fL (ref 80.0–100.0)
MCV: 83.4 fL (ref 80.0–100.0)
Platelets: 279 K/uL (ref 150–400)
Platelets: 332 K/uL (ref 150–400)
RBC: 4.23 MIL/uL (ref 4.22–5.81)
RBC: 4.71 MIL/uL (ref 4.22–5.81)
RDW: 13.7 % (ref 11.5–15.5)
RDW: 13.9 % (ref 11.5–15.5)
WBC: 12.9 K/uL — ABNORMAL HIGH (ref 4.0–10.5)
WBC: 13.4 K/uL — ABNORMAL HIGH (ref 4.0–10.5)
nRBC: 0 % (ref 0.0–0.2)
nRBC: 0 % (ref 0.0–0.2)

## 2023-10-14 LAB — I-STAT CG4 LACTIC ACID, ED: Lactic Acid, Venous: 6.7 mmol/L (ref 0.5–1.9)

## 2023-10-14 LAB — ETHANOL: Alcohol, Ethyl (B): <15 mg/dL (ref <15)

## 2023-10-14 LAB — SAMPLE TO BLOOD BANK

## 2023-10-14 LAB — PROTIME-INR
INR: 1 (ref 0.8–1.2)
Prothrombin Time: 14.2 s (ref 11.4–15.2)

## 2023-10-14 SURGERY — IRRIGATION AND DEBRIDEMENT WOUND
Anesthesia: General | Site: Knee | Laterality: Left

## 2023-10-14 MED ORDER — METOPROLOL TARTRATE 5 MG/5ML IV SOLN: 5.0000 mg | Freq: Four times a day (QID) | INTRAVENOUS | Status: DC | PRN

## 2023-10-14 MED ORDER — IOHEXOL 350 MG/ML SOLN
75.0000 mL | Freq: Once | INTRAVENOUS | Status: AC | PRN
  Administered 2023-10-14 (×2): 75 mL via INTRAVENOUS

## 2023-10-14 MED ORDER — ACETAMINOPHEN 500 MG PO TABS: 1000.0000 mg | ORAL_TABLET | Freq: Once | ORAL | Status: DC

## 2023-10-14 MED ORDER — ENOXAPARIN SODIUM 30 MG/0.3ML IJ SOSY
30.0000 mg | PREFILLED_SYRINGE | Freq: Two times a day (BID) | INTRAMUSCULAR | Status: DC
  Administered 2023-10-15 – 2023-10-16 (×6): 30 mg via SUBCUTANEOUS
  Filled 2023-10-14 (×3): qty 0.3

## 2023-10-14 MED ORDER — CEFAZOLIN SODIUM-DEXTROSE 2-4 GM/100ML-% IV SOLN: 2.0000 g | INTRAVENOUS | Status: DC

## 2023-10-14 MED ORDER — POLYETHYLENE GLYCOL 3350 17 G PO PACK: 17.0000 g | PACK | Freq: Every day | ORAL | Status: DC | PRN

## 2023-10-14 MED ORDER — DOCUSATE SODIUM 100 MG PO CAPS: 100.0000 mg | ORAL_CAPSULE | Freq: Two times a day (BID) | ORAL | Status: DC

## 2023-10-14 MED ORDER — ONDANSETRON HCL 4 MG/2ML IJ SOLN: 4.0000 mg | Freq: Four times a day (QID) | INTRAMUSCULAR | Status: DC | PRN

## 2023-10-14 MED ORDER — FENTANYL CITRATE (PF) 50 MCG/ML IJ SOSY
100.0000 ug | PREFILLED_SYRINGE | Freq: Once | INTRAMUSCULAR | Status: AC
  Administered 2023-10-14 (×2): 100 ug via INTRAVENOUS

## 2023-10-14 MED ORDER — OXYCODONE HCL 5 MG PO TABS: 10.0000 mg | ORAL_TABLET | ORAL | Status: DC | PRN

## 2023-10-14 MED ORDER — HYDROMORPHONE HCL 1 MG/ML IJ SOLN: 1.0000 mg | INTRAMUSCULAR | Status: DC | PRN

## 2023-10-14 MED ORDER — ACETAMINOPHEN 500 MG PO TABS: 1000.0000 mg | ORAL_TABLET | Freq: Four times a day (QID) | ORAL | Status: DC

## 2023-10-14 MED ORDER — TETANUS-DIPHTH-ACELL PERTUSSIS 5-2-15.5 LF-MCG/0.5 IM SUSP
0.5000 mL | Freq: Once | INTRAMUSCULAR | Status: AC
  Administered 2023-10-14 (×2): 0.5 mL via INTRAMUSCULAR

## 2023-10-14 MED ORDER — ONDANSETRON 4 MG PO TBDP: 4.0000 mg | ORAL_TABLET | Freq: Four times a day (QID) | ORAL | Status: DC | PRN

## 2023-10-14 MED ORDER — METHOCARBAMOL 1000 MG/10ML IJ SOLN
500.0000 mg | Freq: Three times a day (TID) | INTRAMUSCULAR | Status: DC
  Administered 2023-10-15 (×2): 500 mg via INTRAVENOUS
  Filled 2023-10-14: qty 10

## 2023-10-14 MED ORDER — TRANEXAMIC ACID-NACL 1000-0.7 MG/100ML-% IV SOLN: 1000.0000 mg | INTRAVENOUS | Status: DC

## 2023-10-14 MED ORDER — OXYCODONE HCL 5 MG PO TABS: 5.0000 mg | ORAL_TABLET | ORAL | Status: DC | PRN

## 2023-10-14 MED ORDER — KETOROLAC TROMETHAMINE 15 MG/ML IJ SOLN
15.0000 mg | Freq: Four times a day (QID) | INTRAMUSCULAR | Status: DC
  Administered 2023-10-15 – 2023-10-16 (×12): 15 mg via INTRAVENOUS
  Filled 2023-10-14 (×6): qty 1

## 2023-10-14 MED ORDER — METHOCARBAMOL 500 MG PO TABS: 500.0000 mg | ORAL_TABLET | Freq: Three times a day (TID) | ORAL | Status: DC

## 2023-10-14 MED ORDER — FENTANYL CITRATE (PF) 50 MCG/ML IJ SOSY: 100.0000 ug | PREFILLED_SYRINGE | Freq: Once | INTRAMUSCULAR | Status: DC

## 2023-10-14 MED ORDER — LACTATED RINGERS IV SOLN: INTRAVENOUS | Status: AC

## 2023-10-14 MED ORDER — CHLORHEXIDINE GLUCONATE 4 % EX SOLN
60.0000 mL | Freq: Once | CUTANEOUS | Status: DC
  Filled 2023-10-14: qty 60

## 2023-10-14 MED ORDER — HYDRALAZINE HCL 20 MG/ML IJ SOLN: 10.0000 mg | INTRAMUSCULAR | Status: DC | PRN

## 2023-10-14 MED ORDER — POVIDONE-IODINE 10 % EX SWAB: 2.0000 | Freq: Once | CUTANEOUS | Status: DC

## 2023-10-14 MED ORDER — HYDROMORPHONE HCL 1 MG/ML IJ SOLN
1.0000 mg | Freq: Once | INTRAMUSCULAR | Status: AC
  Administered 2023-10-14 (×2): 1 mg via INTRAVENOUS
  Filled 2023-10-14: qty 1

## 2023-10-14 MED ORDER — CEFAZOLIN SODIUM-DEXTROSE 2-4 GM/100ML-% IV SOLN
2.0000 g | Freq: Three times a day (TID) | INTRAVENOUS | Status: DC
  Administered 2023-10-15 – 2023-10-16 (×8): 2 g via INTRAVENOUS
  Filled 2023-10-14 (×4): qty 100

## 2023-10-14 MED ORDER — FENTANYL CITRATE (PF) 50 MCG/ML IJ SOSY
PREFILLED_SYRINGE | INTRAMUSCULAR | Status: AC
  Filled 2023-10-14: qty 2

## 2023-10-14 SURGICAL SUPPLY — 39 items
BAG COUNTER SPONGE SURGICOUNT (BAG) ×1 IMPLANT
BANDAGE ESMARK 6X9 LF (GAUZE/BANDAGES/DRESSINGS) ×1 IMPLANT
BNDG ELASTIC 6X10 VLCR STRL LF (GAUZE/BANDAGES/DRESSINGS) IMPLANT
BNDG GAUZE DERMACEA FLUFF 4 (GAUZE/BANDAGES/DRESSINGS) ×1 IMPLANT
CANISTER WOUNDNEG PRESSURE 500 (CANNISTER) IMPLANT
COVER SURGICAL LIGHT HANDLE (MISCELLANEOUS) ×1 IMPLANT
CUFF TOURN SGL QUICK 18X4 (TOURNIQUET CUFF) IMPLANT
CUFF TRNQT CYL 34X4.125X (TOURNIQUET CUFF) IMPLANT
DRESSING PREVENA PLUS CUSTOM (GAUZE/BANDAGES/DRESSINGS) IMPLANT
DRSG XEROFORM 1X8 (GAUZE/BANDAGES/DRESSINGS) IMPLANT
DURAPREP 26ML APPLICATOR (WOUND CARE) ×1 IMPLANT
GAUZE PAD ABD 8X10 STRL (GAUZE/BANDAGES/DRESSINGS) ×1 IMPLANT
GAUZE SPONGE 4X4 12PLY STRL (GAUZE/BANDAGES/DRESSINGS) ×1 IMPLANT
GAUZE XEROFORM 5X9 LF (GAUZE/BANDAGES/DRESSINGS) IMPLANT
GLOVE BIOGEL M 7.0 STRL (GLOVE) ×1 IMPLANT
GLOVE BIOGEL M STRL SZ7.5 (GLOVE) IMPLANT
GLOVE BIOGEL PI IND STRL 7.5 (GLOVE) ×2 IMPLANT
GLOVE BIOGEL PI IND STRL 8.5 (GLOVE) ×1 IMPLANT
GLOVE ECLIPSE 8.0 STRL XLNG CF (GLOVE) IMPLANT
GLOVE ORTHO TXT STRL SZ7.5 (GLOVE) ×2 IMPLANT
GLOVE SURG ORTHO 8.5 STRL (GLOVE) ×3 IMPLANT
GOWN STRL REUS W/ TWL LRG LVL3 (GOWN DISPOSABLE) ×1 IMPLANT
GOWN STRL REUS W/ TWL XL LVL3 (GOWN DISPOSABLE) ×3 IMPLANT
IMMOBILIZER KNEE 20 THIGH 36 (SOFTGOODS) IMPLANT
KIT BASIN OR (CUSTOM PROCEDURE TRAY) ×1 IMPLANT
MANIFOLD NEPTUNE II (INSTRUMENTS) ×1 IMPLANT
PACK ORTHO EXTREMITY (CUSTOM PROCEDURE TRAY) ×1 IMPLANT
PAD CAST 4YDX4 CTTN HI CHSV (CAST SUPPLIES) ×1 IMPLANT
PADDING CAST ABS COTTON 3X4 (CAST SUPPLIES) IMPLANT
SET HNDPC FAN SPRY TIP SCT (DISPOSABLE) ×1 IMPLANT
SPONGE T-LAP 18X18 ~~LOC~~+RFID (SPONGE) IMPLANT
SUT MON AB 2-0 CT1 36 (SUTURE) IMPLANT
SUT STRATAFIX 1PDS 45CM VIOLET (SUTURE) IMPLANT
SUT VIC AB 0 CT1 36 (SUTURE) IMPLANT
SUTURE STRATFX SPIRL PDS+ 70CM (SUTURE) IMPLANT
SYR CONTROL 10ML LL (SYRINGE) ×1 IMPLANT
TOWEL GREEN STERILE (TOWEL DISPOSABLE) ×2 IMPLANT
TUBE CONNECTING 12X1/4 (SUCTIONS) IMPLANT
YANKAUER SUCT BULB TIP NO VENT (SUCTIONS) IMPLANT
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
## 2023-10-14 NOTE — Progress Notes (Signed)
 Orthopedic Tech Progress Note Patient Details:  John Salas 08-Sep-1980 [id number]  Patient ID: John Salas, male   DOB: [birth year], 43 y.o.   MRN: [id number] I attended trauma page. Chandra Dorn PARAS 10/14/2023, 9:42 PM

## 2023-10-14 NOTE — ED Notes (Signed)
 Tourniquet removed at 2128
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
## 2023-10-14 NOTE — ED Notes (Signed)
 Dressing on left knee was removed due to oozing bleeding, no pulsatile bleeding noted but joint was washed out with betadine/saline solution, new abd pad applied with ace bandage.
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
## 2023-10-14 NOTE — ED Notes (Signed)
 C-collar removed from neck, CT imaging was negative
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
## 2023-10-15 ENCOUNTER — Inpatient Hospital Stay (HOSPITAL_COMMUNITY): Payer: Self-pay | Admitting: Anesthesiology

## 2023-10-15 ENCOUNTER — Inpatient Hospital Stay (HOSPITAL_COMMUNITY): Payer: MEDICAID | Admitting: Anesthesiology

## 2023-10-15 ENCOUNTER — Encounter (HOSPITAL_COMMUNITY): Payer: Self-pay

## 2023-10-15 DIAGNOSIS — S72402B Unspecified fracture of lower end of left femur, initial encounter for open fracture type I or II: Secondary | ICD-10-CM

## 2023-10-15 LAB — URINALYSIS, ROUTINE W REFLEX MICROSCOPIC
Bilirubin Urine: NEGATIVE
Glucose, UA: NEGATIVE mg/dL
Ketones, ur: NEGATIVE mg/dL
Leukocytes,Ua: NEGATIVE
Nitrite: NEGATIVE
Protein, ur: NEGATIVE mg/dL
RBC / HPF: >50 RBC/hpf (ref 0–5)
Specific Gravity, Urine: 1.032 — ABNORMAL HIGH (ref 1.005–1.030)
pH: 6 (ref 5.0–8.0)

## 2023-10-15 LAB — CBC
HCT: 25.9 % — ABNORMAL LOW (ref 39.0–52.0)
Hemoglobin: 8.8 g/dL — ABNORMAL LOW (ref 13.0–17.0)
MCH: 27 pg (ref 26.0–34.0)
MCHC: 34 g/dL (ref 30.0–36.0)
MCV: 79.4 fL — ABNORMAL LOW (ref 80.0–100.0)
Platelets: 212 K/uL (ref 150–400)
RBC: 3.26 MIL/uL — ABNORMAL LOW (ref 4.22–5.81)
RDW: 13.8 % (ref 11.5–15.5)
WBC: 9.5 K/uL (ref 4.0–10.5)
nRBC: 0 % (ref 0.0–0.2)

## 2023-10-15 LAB — BASIC METABOLIC PANEL WITH GFR
Anion gap: 12 (ref 5–15)
BUN: 18 mg/dL (ref 6–20)
CO2: 19 mmol/L — ABNORMAL LOW (ref 22–32)
Calcium: 7.9 mg/dL — ABNORMAL LOW (ref 8.9–10.3)
Chloride: 104 mmol/L (ref 98–111)
Creatinine, Ser: 1.1 mg/dL (ref 0.61–1.24)
GFR, Estimated: >60 mL/min (ref >60)
Glucose, Bld: 171 mg/dL — ABNORMAL HIGH (ref 70–99)
Potassium: 3.5 mmol/L (ref 3.5–5.1)
Sodium: 135 mmol/L (ref 135–145)

## 2023-10-15 LAB — MRSA NEXT GEN BY PCR, NASAL: MRSA by PCR Next Gen: NOT DETECTED

## 2023-10-15 LAB — CREATININE, SERUM
Creatinine, Ser: 1.26 mg/dL — ABNORMAL HIGH (ref 0.61–1.24)
GFR, Estimated: >60 mL/min (ref >60)

## 2023-10-15 LAB — HIV ANTIBODY (ROUTINE TESTING W REFLEX): HIV Screen 4th Generation wRfx: NONREACTIVE

## 2023-10-15 MED ORDER — LACTATED RINGERS IV SOLN
INTRAVENOUS | Status: DC | PRN
Start: 2023-10-15 — End: 2023-10-15

## 2023-10-15 MED ORDER — HYDROMORPHONE HCL 1 MG/ML IJ SOLN
0.5000 mg | INTRAMUSCULAR | Status: DC | PRN
  Administered 2023-10-16 (×2): 0.5 mg via INTRAVENOUS
  Filled 2023-10-15: qty 0.5

## 2023-10-15 MED ORDER — OXYCODONE HCL 5 MG PO TABS
5.0000 mg | ORAL_TABLET | ORAL | Status: DC | PRN
  Administered 2023-10-15: 5 mg via ORAL
  Administered 2023-10-15 (×2): 10 mg via ORAL
  Administered 2023-10-15 – 2023-10-16 (×3): 5 mg via ORAL
  Filled 2023-10-15 (×2): qty 2
  Filled 2023-10-15: qty 1

## 2023-10-15 MED ORDER — METHOCARBAMOL 500 MG PO TABS
1000.0000 mg | ORAL_TABLET | Freq: Three times a day (TID) | ORAL | Status: DC
  Administered 2023-10-15 – 2023-10-16 (×8): 1000 mg via ORAL
  Filled 2023-10-15 (×4): qty 2

## 2023-10-15 MED ORDER — MEPERIDINE HCL 100 MG/ML IJ SOLN: 6.2500 mg | INTRAMUSCULAR | Status: DC | PRN

## 2023-10-15 MED ORDER — LIDOCAINE HCL (CARDIAC) PF 50 MG/5ML IV SOSY
PREFILLED_SYRINGE | INTRAVENOUS | Status: DC | PRN
  Administered 2023-10-15 (×2): 30 mg via INTRAVENOUS

## 2023-10-15 MED ORDER — HYDROMORPHONE HCL 1 MG/ML IJ SOLN
INTRAMUSCULAR | Status: AC
  Filled 2023-10-15: qty 1

## 2023-10-15 MED ORDER — ALBUTEROL SULFATE HFA 108 (90 BASE) MCG/ACT IN AERS
INHALATION_SPRAY | RESPIRATORY_TRACT | Status: DC | PRN
  Administered 2023-10-15 (×2): 3 via RESPIRATORY_TRACT

## 2023-10-15 MED ORDER — MIDAZOLAM HCL 2 MG/2ML IJ SOLN
INTRAMUSCULAR | Status: AC
  Filled 2023-10-15: qty 2

## 2023-10-15 MED ORDER — PROPOFOL 10 MG/ML IV BOLUS
INTRAVENOUS | Status: AC
  Filled 2023-10-15: qty 20

## 2023-10-15 MED ORDER — EPHEDRINE SULFATE (PRESSORS) 50 MG/ML IJ SOLN
INTRAMUSCULAR | Status: DC | PRN
  Administered 2023-10-15: 10 mg via INTRAVENOUS
  Administered 2023-10-15 (×4): 5 mg via INTRAVENOUS
  Administered 2023-10-15: 10 mg via INTRAVENOUS
  Administered 2023-10-15 (×2): 5 mg via INTRAVENOUS

## 2023-10-15 MED ORDER — ONDANSETRON HCL 4 MG/2ML IJ SOLN
INTRAMUSCULAR | Status: DC | PRN
  Administered 2023-10-15 (×2): 4 mg via INTRAVENOUS

## 2023-10-15 MED ORDER — DEXAMETHASONE SODIUM PHOSPHATE 4 MG/ML IJ SOLN
INTRAMUSCULAR | Status: DC | PRN
  Administered 2023-10-15 (×2): 10 mg via INTRAVENOUS

## 2023-10-15 MED ORDER — OXYCODONE HCL 5 MG/5ML PO SOLN: 5.0000 mg | Freq: Once | ORAL | Status: DC | PRN

## 2023-10-15 MED ORDER — DOCUSATE SODIUM 100 MG PO CAPS
100.0000 mg | ORAL_CAPSULE | Freq: Two times a day (BID) | ORAL | Status: DC
  Administered 2023-10-15 – 2023-10-16 (×6): 100 mg via ORAL
  Filled 2023-10-15 (×3): qty 1

## 2023-10-15 MED ORDER — MIDAZOLAM HCL 2 MG/2ML IJ SOLN: 0.5000 mg | Freq: Once | INTRAMUSCULAR | Status: DC | PRN

## 2023-10-15 MED ORDER — PHENYLEPHRINE HCL (PRESSORS) 10 MG/ML IV SOLN
INTRAVENOUS | Status: DC | PRN
  Administered 2023-10-15 (×2): 80 ug via INTRAVENOUS

## 2023-10-15 MED ORDER — FENTANYL CITRATE (PF) 100 MCG/2ML IJ SOLN
INTRAMUSCULAR | Status: DC | PRN
  Administered 2023-10-15 (×8): 50 ug via INTRAVENOUS

## 2023-10-15 MED ORDER — METOCLOPRAMIDE HCL 5 MG PO TABS: 5.0000 mg | ORAL_TABLET | Freq: Three times a day (TID) | ORAL | Status: DC | PRN

## 2023-10-15 MED ORDER — PHENYLEPHRINE HCL-NACL 20-0.9 MG/250ML-% IV SOLN
INTRAVENOUS | Status: DC | PRN
  Administered 2023-10-15 (×2): 25 ug/min via INTRAVENOUS

## 2023-10-15 MED ORDER — METOCLOPRAMIDE HCL 5 MG/ML IJ SOLN: 5.0000 mg | Freq: Three times a day (TID) | INTRAMUSCULAR | Status: DC | PRN

## 2023-10-15 MED ORDER — HYDROMORPHONE HCL 1 MG/ML IJ SOLN
0.2500 mg | INTRAMUSCULAR | Status: DC | PRN
  Administered 2023-10-15 (×4): 0.5 mg via INTRAVENOUS

## 2023-10-15 MED ORDER — ACETAMINOPHEN 10 MG/ML IV SOLN
INTRAVENOUS | Status: DC | PRN
  Administered 2023-10-15 (×2): 1000 mg via INTRAVENOUS

## 2023-10-15 MED ORDER — PROPOFOL 10 MG/ML IV BOLUS
INTRAVENOUS | Status: DC | PRN
  Administered 2023-10-15: 30 mg via INTRAVENOUS
  Administered 2023-10-15: 120 mg via INTRAVENOUS
  Administered 2023-10-15: 30 mg via INTRAVENOUS
  Administered 2023-10-15: 120 mg via INTRAVENOUS

## 2023-10-15 MED ORDER — OXYCODONE HCL 5 MG PO TABS: 5.0000 mg | ORAL_TABLET | Freq: Once | ORAL | Status: DC | PRN

## 2023-10-15 MED ORDER — SUCCINYLCHOLINE CHLORIDE 20 MG/ML IJ SOLN
INTRAMUSCULAR | Status: DC | PRN
  Administered 2023-10-15 (×2): 140 mg via INTRAVENOUS

## 2023-10-15 MED ORDER — FENTANYL CITRATE (PF) 250 MCG/5ML IJ SOLN
INTRAMUSCULAR | Status: AC
  Filled 2023-10-15: qty 5

## 2023-10-15 MED ORDER — ACETAMINOPHEN 10 MG/ML IV SOLN
INTRAVENOUS | Status: AC
  Filled 2023-10-15: qty 100

## 2023-10-15 MED ORDER — CEFAZOLIN SODIUM-DEXTROSE 2-3 GM-%(50ML) IV SOLR
INTRAVENOUS | Status: DC | PRN
  Administered 2023-10-15 (×2): 2 g via INTRAVENOUS

## 2023-10-15 MED ORDER — CEFAZOLIN SODIUM-DEXTROSE 2-4 GM/100ML-% IV SOLN
INTRAVENOUS | Status: AC
  Filled 2023-10-15: qty 100

## 2023-10-15 NOTE — Anesthesia Procedure Notes (Signed)
 Procedure Name: Intubation Date/Time: 10/15/2023 1:20 AM  Performed by: Celia Alan HERO, CRNAPre-anesthesia Checklist: Patient identified, Emergency Drugs available, Suction available, Patient being monitored and Timeout performed Patient Re-evaluated:Patient Re-evaluated prior to induction Oxygen Delivery Method: Circle system utilized Preoxygenation: Pre-oxygenation with 100% oxygen Induction Type: IV induction, Rapid sequence and Cricoid Pressure applied Laryngoscope Size: Miller and 2 Grade View: Grade I Tube type: Oral Tube size: 7.5 mm Number of attempts: 1 Airway Equipment and Method: Stylet Placement Confirmation: ETT inserted through vocal cords under direct vision, positive ETCO2 and breath sounds checked- equal and bilateral Secured at: 22 cm Tube secured with: Tape Dental Injury: Teeth and Oropharynx as per pre-operative assessment

## 2023-10-15 NOTE — Progress Notes (Signed)
 Patient assisted and educated on the use of incentive spirometry, educated the patient with the medicines and assisted with repositioning and placement of SCD. Assisted the patient in ordering breakfast. Patient is a practicing muslim prefers halal food.
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
## 2023-10-15 NOTE — Progress Notes (Signed)
 Microbiology called and want to change the order for the PCR of the patient.

## 2023-10-15 NOTE — Progress Notes (Signed)
 Received call from OR to give report to CRNA for a procedure.

## 2023-10-15 NOTE — Anesthesia Postprocedure Evaluation (Signed)
 Anesthesia Post Note  Patient: John Salas  Procedure(s) Performed: IRRIGATION AND DEBRIDEMENT WOUND (Left: Knee)     Patient location during evaluation: PACU Anesthesia Type: General Level of consciousness: awake and alert, oriented and patient cooperative Pain management: pain level controlled Vital Signs Assessment: post-procedure vital signs reviewed and stable Respiratory status: spontaneous breathing, nonlabored ventilation and respiratory function stable Cardiovascular status: blood pressure returned to baseline and stable Postop Assessment: no apparent nausea or vomiting and adequate PO intake Anesthetic complications: no   No notable events documented.  Last Vitals:  Vitals:   10/15/23 0415 10/15/23 0516  BP: 134/87 135/81  Pulse: 94 95  Resp: 12   Temp: 36.6 C 37 C  SpO2: 99% 100%    Last Pain:  Vitals:   10/15/23 0415  TempSrc:   PainSc: 3                  Kemonte Ullman,E. Taleah Bellantoni

## 2023-10-15 NOTE — Progress Notes (Signed)
Vital signs taken

## 2023-10-15 NOTE — Progress Notes (Signed)
 Chaplain followed up on Level 1 Page. No family bedside. No needs at this time. Oncoming chaplain may follow up, and we remain available as further needs arise.

## 2023-10-15 NOTE — Transfer of Care (Signed)
 Immediate Anesthesia Transfer of Care Note  Patient: John Salas  Procedure(s) Performed: IRRIGATION AND DEBRIDEMENT WOUND (Left: Knee)  Patient Location: PACU  Anesthesia Type:General  Level of Consciousness: awake, alert , and oriented  Airway & Oxygen Therapy: Patient Spontanous Breathing  Post-op Assessment: Report given to RN and Post -op Vital signs reviewed and stable  Post vital signs: Reviewed and stable  Last Vitals:  Vitals Value Taken Time  BP 117/86 10/15/23 03:25  Temp    Pulse 120 10/15/23 03:29  Resp 18 10/15/23 03:29  SpO2 100 % 10/15/23 03:29  Vitals shown include unfiled device data.  Last Pain:  Vitals:   10/14/23 2338  TempSrc: Oral  PainSc:          Complications: No notable events documented.
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
## 2023-10-15 NOTE — Plan of Care (Signed)
   Problem: Education: Goal: Knowledge of General Education information will improve Description Including pain rating scale, medication(s)/side effects and non-pharmacologic comfort measures Outcome: Progressing

## 2023-10-15 NOTE — Progress Notes (Signed)
Patient arrived in the unit

## 2023-10-15 NOTE — Progress Notes (Signed)
CHG bath given 
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
## 2023-10-15 NOTE — Progress Notes (Signed)
 Consent signature acquired as ordered.

## 2023-10-15 NOTE — TOC CAGE-AID Note (Signed)
 Transition of Care [facility]) - CAGE-AID Screening   Patient Details  Name: John Salas MRN: [id number] Date of Birth: [birth year]  [facility]) CM/SW Contact:    Narmeen Kerper E Blessed Cotham, LCSW Phone Number: 10/15/2023, 9:01 AM   Clinical Narrative: No SA noted.   CAGE-AID Screening:    Have You Ever Felt You Ought to Cut Down on Your Drinking or Drug Use?: No Have People Annoyed You By Critizing Your Drinking Or Drug Use?: No Have You Felt Bad Or Guilty About Your Drinking Or Drug Use?: No Have You Ever Had a Drink or Used Drugs First Thing In The Morning to Steady Your Nerves or to Get Rid of a Hangover?: No CAGE-AID Score: 0  Substance Abuse Education Offered: No
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
## 2023-10-15 NOTE — Progress Notes (Signed)
 Patient arrived back in the unit

## 2023-10-16 ENCOUNTER — Other Ambulatory Visit: Payer: Self-pay

## 2023-10-16 ENCOUNTER — Other Ambulatory Visit (HOSPITAL_COMMUNITY): Payer: Self-pay

## 2023-10-16 MED ORDER — ACETAMINOPHEN 500 MG PO TABS
1000.0000 mg | ORAL_TABLET | Freq: Four times a day (QID) | ORAL | Status: DC
  Administered 2023-10-16 (×2): 1000 mg via ORAL
  Filled 2023-10-16: qty 2

## 2023-10-16 MED ORDER — GABAPENTIN 300 MG PO CAPS
300.0000 mg | ORAL_CAPSULE | Freq: Three times a day (TID) | ORAL | Status: DC
  Administered 2023-10-16 (×2): 300 mg via ORAL
  Filled 2023-10-16: qty 1

## 2023-10-16 MED ORDER — ACETAMINOPHEN 500 MG PO TABS: 1000.0000 mg | ORAL_TABLET | Freq: Four times a day (QID) | ORAL | Status: AC | PRN

## 2023-10-16 MED ORDER — OXYCODONE HCL 5 MG PO TABS
5.0000 mg | ORAL_TABLET | ORAL | 0 refills | Status: AC | PRN
  Filled 2023-10-16: qty 30, 3d supply, fill #0

## 2023-10-16 MED ORDER — GABAPENTIN 300 MG PO CAPS
300.0000 mg | ORAL_CAPSULE | Freq: Three times a day (TID) | ORAL | 0 refills | Status: AC
  Filled 2023-10-16: qty 90, 30d supply, fill #0

## 2023-10-16 MED ORDER — METHOCARBAMOL 500 MG PO TABS
1000.0000 mg | ORAL_TABLET | Freq: Three times a day (TID) | ORAL | 0 refills | Status: AC | PRN
  Filled 2023-10-16: qty 60, 10d supply, fill #0

## 2023-10-16 MED ORDER — ASPIRIN 81 MG PO CHEW
81.0000 mg | CHEWABLE_TABLET | Freq: Two times a day (BID) | ORAL | 0 refills | Status: AC
  Filled 2023-10-16: qty 90, 45d supply, fill #0
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
## 2023-10-16 NOTE — Plan of Care (Signed)
  Problem: Nutrition: Goal: Adequate nutrition will be maintained Outcome: Progressing   Problem: Coping: Goal: Level of anxiety will decrease Outcome: Progressing   Problem: Elimination: Goal: Will not experience complications related to bowel motility Outcome: Progressing   Problem: Pain Managment: Goal: General experience of comfort will improve and/or be controlled Outcome: Progressing

## 2023-10-16 NOTE — Progress Notes (Signed)
 AVS given and explained to patient, wound vac switched to Prevena wound vac, Rolling walker at bedside. BSC to be delivered at patient's home.
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
## 2023-10-16 NOTE — Progress Notes (Addendum)
 Mobility Specialist Progress Note:    10/16/23 1153  Mobility  Activity Ambulated with assistance (In room)  Level of Assistance Contact guard assist, steadying assist  Assistive Device Front wheel walker  Distance Ambulated (ft) 10 ft  LLE Weight Bearing Per Provider Order WBAT  Activity Response Tolerated well  Mobility Referral Yes  Mobility visit 1 Mobility  Mobility Specialist Start Time (ACUTE ONLY) 1007  Mobility Specialist Stop Time (ACUTE ONLY) 1018  Mobility Specialist Time Calculation (min) (ACUTE ONLY) 11 min   Received pt in chair and agreeable to mobility. Pt required MinG for safety. Further mobility deferred d/t pt c/o R hand IV pain getting worse during ambulation. Left pt in chair with alarm on. Personal belongings and call light within reach. All needs met. RN aware.  Lavanda Pollack Mobility Specialist  Please contact via [employer] or  Rehab Office [phone]
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
## 2023-10-16 NOTE — Discharge Instructions (Addendum)
 Dr. Redell Shoals [facility] Specialist [facility] [address], [address] [place], [place] [address] [phone]     HOME CARE INSTRUCTIONS  Remove items at home which could result in a fall. This includes throw rugs or furniture in walking pathways.  Continue medications as instructed at time of discharge. You may have some home medications which will be placed on hold until you complete the course of blood thinner medication.  Keep dressing clean and dry.  Walk with walker as instructed.  You may resume a sexual relationship in one month or when given the OK by your doctor.  Use walker as long as suggested by your caregivers. Avoid periods of inactivity such as sitting longer than an hour when not asleep. This helps prevent blood clots.  You may put full weight on your legs and walk as much as is comfortable.  You may return to work once you are cleared by your doctor.  Do not drive a car for 6 weeks or until released by you surgeon.  Do not drive while taking narcotics.  Wear the elastic stockings for three weeks following surgery during the day but you may remove then at night. Make sure you keep all of your appointments after your operation with all of your doctors and caregivers. You should call the office at the above phone number and make an appointment for approximately two weeks after the date of your surgery. Do not remove your surgical dressing.  Please pick up a stool softener and laxative for home use as long as you are requiring pain medications. ICE to the affected knee every three hours for 30 minutes at a time and then as needed for pain and swelling.  Continue to use ice on the knee for pain and swelling from surgery. You may notice swelling that will progress down to the foot and ankle.  This is normal after surgery.  Elevate the leg when you are not up walking on it.   It is important for you to complete the blood thinner medication as  prescribed by your doctor. Continue to use the breathing machine which will help keep your temperature down.  It is common for your temperature to cycle up and down following surgery, especially at night when you are not up moving around and exerting yourself.  The breathing machine keeps your lungs expanded and your temperature down.  RANGE OF MOTION AND STRENGTHENING EXERCISES  Rehabilitation of the knee is important following a knee injury or an operation. After just a few days of immobilization, the muscles of the thigh which control the knee become weakened and shrink (atrophy). Knee exercises are designed to build up the tone and strength of the thigh muscles and to improve knee motion. Often times heat used for twenty to thirty minutes before working out will loosen up your tissues and help with improving the range of motion but do not use heat for the first two weeks following surgery. These exercises can be done on a training (exercise) mat, on the floor, on a table or on a bed. Use what ever works the best and is most comfortable for you Knee exercises include:  Leg Lifts - While your knee is still immobilized in a splint or cast, you can do straight leg raises. Lift the leg to 60 degrees, hold for 3 sec, and slowly lower the leg. Repeat 10-20 times 2-3 times daily. Perform this exercise against resistance later as your  knee gets better.  Quad and Hamstring Sets - Tighten up the muscle on the front of the thigh (Quad) and hold for 5-10 sec. Repeat this 10-20 times hourly. Hamstring sets are done by pushing the foot backward against an object and holding for 5-10 sec. Repeat as with quad sets.  A rehabilitation program following serious knee injuries can speed recovery and prevent re-injury in the future due to weakened muscles. Contact your doctor or a physical therapist for more information on knee rehabilitation.   POST-OPERATIVE OPIOID TAPER INSTRUCTIONS: It is important to wean off of your  opioid medication as soon as possible. If you do not need pain medication after your surgery it is ok to stop day one. Opioids include: Codeine, Hydrocodone(Norco, Vicodin), Oxycodone (Percocet, oxycontin ) and hydromorphone amongst others.  Long term and even short term use of opiods can cause: Increased pain response Dependence Constipation Depression Respiratory depression And more.  Withdrawal symptoms can include Flu like symptoms Nausea, vomiting And more Techniques to manage these symptoms Hydrate well Eat regular healthy meals Stay active Use relaxation techniques(deep breathing, meditating, yoga) Do Not substitute Alcohol to help with tapering If you have been on opioids for less than two weeks and do not have pain than it is ok to stop all together.  Plan to wean off of opioids This plan should start within one week post op of your joint replacement. Maintain the same interval or time between taking each dose and first decrease the dose.  Cut the total daily intake of opioids by one tablet each day Next start to increase the time between doses. The last dose that should be eliminated is the evening dose.     MAKE SURE YOU:  Understand these instructions.  Will watch your condition.  Will get help right away if you are not doing well or get worse.    Pick up stool softner and laxative for home use following surgery while on pain medications. Continue to use ice for pain and swelling after surgery. Do not use any lotions or creams on the incision until instructed by your surgeon. - Weightbearing as tolerated on left lower extremity with walker and knee immobilizer.  - Keep dressing clean and dry.  - No not get your incision wet.  - Please charge Prevena portable wound vac daily.  - Please make follow-up appointment Tuesday 10/23/23 for removal of your Prevena negative pressure incisional dressing and wound check.  - Please use ice to help with pain and swelling.  -  Do not bend your knee.
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
## 2023-10-17 ENCOUNTER — Encounter (HOSPITAL_BASED_OUTPATIENT_CLINIC_OR_DEPARTMENT_OTHER): Payer: Self-pay | Admitting: Emergency Medicine
